# Patient Record
Sex: Female | Born: 1950 | Race: White | Hispanic: No | Marital: Married | Smoking: Current some day smoker
Health system: Southern US, Community
[De-identification: ages and names within clinical notes are randomized; demographics above are authoritative.]

## PROBLEM LIST (undated history)

## (undated) DIAGNOSIS — I10 Essential (primary) hypertension: Secondary | ICD-10-CM

## (undated) DIAGNOSIS — J439 Emphysema, unspecified: Secondary | ICD-10-CM

## (undated) DIAGNOSIS — C189 Malignant neoplasm of colon, unspecified: Secondary | ICD-10-CM

## (undated) DIAGNOSIS — C069 Malignant neoplasm of mouth, unspecified: Secondary | ICD-10-CM

## (undated) HISTORY — DX: Malignant neoplasm of colon, unspecified: C18.9

## (undated) HISTORY — DX: Emphysema, unspecified: J43.9

## (undated) HISTORY — DX: Malignant neoplasm of mouth, unspecified: C06.9

## (undated) HISTORY — DX: Essential (primary) hypertension: I10

## (undated) HISTORY — PX: PLACEMENT OF BREAST IMPLANTS: SHX6334

---

## 2015-07-24 ENCOUNTER — Ambulatory Visit (HOSPITAL_COMMUNITY): Payer: BLUE CROSS/BLUE SHIELD | Admitting: Psychiatry

## 2015-08-13 ENCOUNTER — Ambulatory Visit (HOSPITAL_COMMUNITY): Payer: BLUE CROSS/BLUE SHIELD | Admitting: Psychiatry

## 2016-04-07 DIAGNOSIS — L720 Epidermal cyst: Secondary | ICD-10-CM | POA: Diagnosis not present

## 2016-04-07 DIAGNOSIS — D485 Neoplasm of uncertain behavior of skin: Secondary | ICD-10-CM | POA: Diagnosis not present

## 2016-04-07 DIAGNOSIS — L905 Scar conditions and fibrosis of skin: Secondary | ICD-10-CM | POA: Diagnosis not present

## 2016-04-07 DIAGNOSIS — R600 Localized edema: Secondary | ICD-10-CM | POA: Diagnosis not present

## 2016-04-09 DIAGNOSIS — I10 Essential (primary) hypertension: Secondary | ICD-10-CM | POA: Diagnosis not present

## 2016-04-09 DIAGNOSIS — F419 Anxiety disorder, unspecified: Secondary | ICD-10-CM | POA: Diagnosis not present

## 2016-04-09 DIAGNOSIS — J441 Chronic obstructive pulmonary disease with (acute) exacerbation: Secondary | ICD-10-CM | POA: Diagnosis not present

## 2016-04-09 DIAGNOSIS — Z6823 Body mass index (BMI) 23.0-23.9, adult: Secondary | ICD-10-CM | POA: Diagnosis not present

## 2016-04-09 DIAGNOSIS — Z299 Encounter for prophylactic measures, unspecified: Secondary | ICD-10-CM | POA: Diagnosis not present

## 2016-05-05 DIAGNOSIS — Z6822 Body mass index (BMI) 22.0-22.9, adult: Secondary | ICD-10-CM | POA: Diagnosis not present

## 2016-05-05 DIAGNOSIS — Z0000 Encounter for general adult medical examination without abnormal findings: Secondary | ICD-10-CM | POA: Diagnosis not present

## 2016-05-05 DIAGNOSIS — Z299 Encounter for prophylactic measures, unspecified: Secondary | ICD-10-CM | POA: Diagnosis not present

## 2016-05-05 DIAGNOSIS — Z1389 Encounter for screening for other disorder: Secondary | ICD-10-CM | POA: Diagnosis not present

## 2016-05-05 DIAGNOSIS — Z1211 Encounter for screening for malignant neoplasm of colon: Secondary | ICD-10-CM | POA: Diagnosis not present

## 2016-05-05 DIAGNOSIS — Z7189 Other specified counseling: Secondary | ICD-10-CM | POA: Diagnosis not present

## 2016-05-12 DIAGNOSIS — F419 Anxiety disorder, unspecified: Secondary | ICD-10-CM | POA: Diagnosis not present

## 2016-05-12 DIAGNOSIS — Z79899 Other long term (current) drug therapy: Secondary | ICD-10-CM | POA: Diagnosis not present

## 2016-05-12 DIAGNOSIS — I10 Essential (primary) hypertension: Secondary | ICD-10-CM | POA: Diagnosis not present

## 2016-05-13 DIAGNOSIS — Z23 Encounter for immunization: Secondary | ICD-10-CM | POA: Diagnosis not present

## 2016-05-18 DIAGNOSIS — Z1231 Encounter for screening mammogram for malignant neoplasm of breast: Secondary | ICD-10-CM | POA: Diagnosis not present

## 2016-05-18 DIAGNOSIS — Z9882 Breast implant status: Secondary | ICD-10-CM | POA: Diagnosis not present

## 2016-07-07 DIAGNOSIS — J449 Chronic obstructive pulmonary disease, unspecified: Secondary | ICD-10-CM | POA: Diagnosis not present

## 2016-07-07 DIAGNOSIS — I10 Essential (primary) hypertension: Secondary | ICD-10-CM | POA: Diagnosis not present

## 2016-07-07 DIAGNOSIS — F419 Anxiety disorder, unspecified: Secondary | ICD-10-CM | POA: Diagnosis not present

## 2016-07-07 DIAGNOSIS — F17210 Nicotine dependence, cigarettes, uncomplicated: Secondary | ICD-10-CM | POA: Diagnosis not present

## 2016-07-07 DIAGNOSIS — Z299 Encounter for prophylactic measures, unspecified: Secondary | ICD-10-CM | POA: Diagnosis not present

## 2016-07-07 DIAGNOSIS — J441 Chronic obstructive pulmonary disease with (acute) exacerbation: Secondary | ICD-10-CM | POA: Diagnosis not present

## 2016-07-14 DIAGNOSIS — Z713 Dietary counseling and surveillance: Secondary | ICD-10-CM | POA: Diagnosis not present

## 2016-07-14 DIAGNOSIS — Z299 Encounter for prophylactic measures, unspecified: Secondary | ICD-10-CM | POA: Diagnosis not present

## 2016-07-14 DIAGNOSIS — I10 Essential (primary) hypertension: Secondary | ICD-10-CM | POA: Diagnosis not present

## 2016-07-14 DIAGNOSIS — J441 Chronic obstructive pulmonary disease with (acute) exacerbation: Secondary | ICD-10-CM | POA: Diagnosis not present

## 2016-07-14 DIAGNOSIS — Z6822 Body mass index (BMI) 22.0-22.9, adult: Secondary | ICD-10-CM | POA: Diagnosis not present

## 2016-07-14 DIAGNOSIS — F17210 Nicotine dependence, cigarettes, uncomplicated: Secondary | ICD-10-CM | POA: Diagnosis not present

## 2016-07-28 DIAGNOSIS — E2839 Other primary ovarian failure: Secondary | ICD-10-CM | POA: Diagnosis not present

## 2016-09-18 DIAGNOSIS — J441 Chronic obstructive pulmonary disease with (acute) exacerbation: Secondary | ICD-10-CM | POA: Diagnosis not present

## 2016-09-18 DIAGNOSIS — J449 Chronic obstructive pulmonary disease, unspecified: Secondary | ICD-10-CM | POA: Diagnosis not present

## 2016-09-18 DIAGNOSIS — F17210 Nicotine dependence, cigarettes, uncomplicated: Secondary | ICD-10-CM | POA: Diagnosis not present

## 2016-09-18 DIAGNOSIS — Z713 Dietary counseling and surveillance: Secondary | ICD-10-CM | POA: Diagnosis not present

## 2016-09-18 DIAGNOSIS — Z299 Encounter for prophylactic measures, unspecified: Secondary | ICD-10-CM | POA: Diagnosis not present

## 2016-09-18 DIAGNOSIS — Z6821 Body mass index (BMI) 21.0-21.9, adult: Secondary | ICD-10-CM | POA: Diagnosis not present

## 2016-09-18 DIAGNOSIS — F419 Anxiety disorder, unspecified: Secondary | ICD-10-CM | POA: Diagnosis not present

## 2016-11-05 DIAGNOSIS — K5730 Diverticulosis of large intestine without perforation or abscess without bleeding: Secondary | ICD-10-CM | POA: Diagnosis not present

## 2016-11-05 DIAGNOSIS — F329 Major depressive disorder, single episode, unspecified: Secondary | ICD-10-CM | POA: Diagnosis not present

## 2016-11-05 DIAGNOSIS — Z1211 Encounter for screening for malignant neoplasm of colon: Secondary | ICD-10-CM | POA: Diagnosis not present

## 2016-11-05 DIAGNOSIS — K635 Polyp of colon: Secondary | ICD-10-CM | POA: Diagnosis not present

## 2016-11-05 DIAGNOSIS — J449 Chronic obstructive pulmonary disease, unspecified: Secondary | ICD-10-CM | POA: Diagnosis not present

## 2016-11-05 DIAGNOSIS — M069 Rheumatoid arthritis, unspecified: Secondary | ICD-10-CM | POA: Diagnosis not present

## 2016-11-05 DIAGNOSIS — I10 Essential (primary) hypertension: Secondary | ICD-10-CM | POA: Diagnosis not present

## 2016-11-05 DIAGNOSIS — F419 Anxiety disorder, unspecified: Secondary | ICD-10-CM | POA: Diagnosis not present

## 2016-11-05 DIAGNOSIS — M810 Age-related osteoporosis without current pathological fracture: Secondary | ICD-10-CM | POA: Diagnosis not present

## 2016-11-05 DIAGNOSIS — D125 Benign neoplasm of sigmoid colon: Secondary | ICD-10-CM | POA: Diagnosis not present

## 2016-11-05 DIAGNOSIS — M1990 Unspecified osteoarthritis, unspecified site: Secondary | ICD-10-CM | POA: Diagnosis not present

## 2016-11-13 DIAGNOSIS — F419 Anxiety disorder, unspecified: Secondary | ICD-10-CM | POA: Diagnosis not present

## 2016-11-13 DIAGNOSIS — Z713 Dietary counseling and surveillance: Secondary | ICD-10-CM | POA: Diagnosis not present

## 2016-11-13 DIAGNOSIS — Z6822 Body mass index (BMI) 22.0-22.9, adult: Secondary | ICD-10-CM | POA: Diagnosis not present

## 2016-11-13 DIAGNOSIS — F17210 Nicotine dependence, cigarettes, uncomplicated: Secondary | ICD-10-CM | POA: Diagnosis not present

## 2016-11-13 DIAGNOSIS — Z299 Encounter for prophylactic measures, unspecified: Secondary | ICD-10-CM | POA: Diagnosis not present

## 2016-11-30 DIAGNOSIS — M1612 Unilateral primary osteoarthritis, left hip: Secondary | ICD-10-CM | POA: Diagnosis not present

## 2016-11-30 DIAGNOSIS — M5136 Other intervertebral disc degeneration, lumbar region: Secondary | ICD-10-CM | POA: Diagnosis not present

## 2017-01-04 DIAGNOSIS — M5136 Other intervertebral disc degeneration, lumbar region: Secondary | ICD-10-CM | POA: Diagnosis not present

## 2017-01-04 DIAGNOSIS — M1612 Unilateral primary osteoarthritis, left hip: Secondary | ICD-10-CM | POA: Diagnosis not present

## 2017-01-06 DIAGNOSIS — M1990 Unspecified osteoarthritis, unspecified site: Secondary | ICD-10-CM | POA: Diagnosis not present

## 2017-01-06 DIAGNOSIS — M25552 Pain in left hip: Secondary | ICD-10-CM | POA: Diagnosis not present

## 2017-02-12 DIAGNOSIS — H02403 Unspecified ptosis of bilateral eyelids: Secondary | ICD-10-CM | POA: Diagnosis not present

## 2017-02-26 DIAGNOSIS — Z299 Encounter for prophylactic measures, unspecified: Secondary | ICD-10-CM | POA: Diagnosis not present

## 2017-02-26 DIAGNOSIS — Z6822 Body mass index (BMI) 22.0-22.9, adult: Secondary | ICD-10-CM | POA: Diagnosis not present

## 2017-02-26 DIAGNOSIS — F419 Anxiety disorder, unspecified: Secondary | ICD-10-CM | POA: Diagnosis not present

## 2017-02-26 DIAGNOSIS — J449 Chronic obstructive pulmonary disease, unspecified: Secondary | ICD-10-CM | POA: Diagnosis not present

## 2017-02-26 DIAGNOSIS — I10 Essential (primary) hypertension: Secondary | ICD-10-CM | POA: Diagnosis not present

## 2017-02-26 DIAGNOSIS — F17210 Nicotine dependence, cigarettes, uncomplicated: Secondary | ICD-10-CM | POA: Diagnosis not present

## 2017-02-26 DIAGNOSIS — E7800 Pure hypercholesterolemia, unspecified: Secondary | ICD-10-CM | POA: Diagnosis not present

## 2017-02-26 DIAGNOSIS — Z713 Dietary counseling and surveillance: Secondary | ICD-10-CM | POA: Diagnosis not present

## 2017-03-31 DIAGNOSIS — I10 Essential (primary) hypertension: Secondary | ICD-10-CM | POA: Diagnosis not present

## 2017-03-31 DIAGNOSIS — Z713 Dietary counseling and surveillance: Secondary | ICD-10-CM | POA: Diagnosis not present

## 2017-03-31 DIAGNOSIS — J449 Chronic obstructive pulmonary disease, unspecified: Secondary | ICD-10-CM | POA: Diagnosis not present

## 2017-03-31 DIAGNOSIS — F419 Anxiety disorder, unspecified: Secondary | ICD-10-CM | POA: Diagnosis not present

## 2017-03-31 DIAGNOSIS — E7800 Pure hypercholesterolemia, unspecified: Secondary | ICD-10-CM | POA: Diagnosis not present

## 2017-03-31 DIAGNOSIS — Z6821 Body mass index (BMI) 21.0-21.9, adult: Secondary | ICD-10-CM | POA: Diagnosis not present

## 2017-03-31 DIAGNOSIS — F17210 Nicotine dependence, cigarettes, uncomplicated: Secondary | ICD-10-CM | POA: Diagnosis not present

## 2017-03-31 DIAGNOSIS — F329 Major depressive disorder, single episode, unspecified: Secondary | ICD-10-CM | POA: Diagnosis not present

## 2017-03-31 DIAGNOSIS — Z299 Encounter for prophylactic measures, unspecified: Secondary | ICD-10-CM | POA: Diagnosis not present

## 2017-04-29 DIAGNOSIS — D485 Neoplasm of uncertain behavior of skin: Secondary | ICD-10-CM | POA: Diagnosis not present

## 2017-04-29 DIAGNOSIS — D045 Carcinoma in situ of skin of trunk: Secondary | ICD-10-CM | POA: Diagnosis not present

## 2017-05-10 DIAGNOSIS — Z6821 Body mass index (BMI) 21.0-21.9, adult: Secondary | ICD-10-CM | POA: Diagnosis not present

## 2017-05-10 DIAGNOSIS — F17210 Nicotine dependence, cigarettes, uncomplicated: Secondary | ICD-10-CM | POA: Diagnosis not present

## 2017-05-10 DIAGNOSIS — Z299 Encounter for prophylactic measures, unspecified: Secondary | ICD-10-CM | POA: Diagnosis not present

## 2017-05-10 DIAGNOSIS — I10 Essential (primary) hypertension: Secondary | ICD-10-CM | POA: Diagnosis not present

## 2017-05-10 DIAGNOSIS — J449 Chronic obstructive pulmonary disease, unspecified: Secondary | ICD-10-CM | POA: Diagnosis not present

## 2017-06-01 DIAGNOSIS — C44529 Squamous cell carcinoma of skin of other part of trunk: Secondary | ICD-10-CM | POA: Diagnosis not present

## 2017-06-18 DIAGNOSIS — S92355A Nondisplaced fracture of fifth metatarsal bone, left foot, initial encounter for closed fracture: Secondary | ICD-10-CM | POA: Diagnosis not present

## 2017-07-07 DIAGNOSIS — I10 Essential (primary) hypertension: Secondary | ICD-10-CM | POA: Diagnosis not present

## 2017-07-07 DIAGNOSIS — Z299 Encounter for prophylactic measures, unspecified: Secondary | ICD-10-CM | POA: Diagnosis not present

## 2017-07-07 DIAGNOSIS — J449 Chronic obstructive pulmonary disease, unspecified: Secondary | ICD-10-CM | POA: Diagnosis not present

## 2017-07-07 DIAGNOSIS — F419 Anxiety disorder, unspecified: Secondary | ICD-10-CM | POA: Diagnosis not present

## 2017-07-07 DIAGNOSIS — F17210 Nicotine dependence, cigarettes, uncomplicated: Secondary | ICD-10-CM | POA: Diagnosis not present

## 2017-07-07 DIAGNOSIS — Z6822 Body mass index (BMI) 22.0-22.9, adult: Secondary | ICD-10-CM | POA: Diagnosis not present

## 2017-07-19 DIAGNOSIS — M25552 Pain in left hip: Secondary | ICD-10-CM | POA: Diagnosis not present

## 2017-07-19 DIAGNOSIS — M84375D Stress fracture, left foot, subsequent encounter for fracture with routine healing: Secondary | ICD-10-CM | POA: Diagnosis not present

## 2017-08-09 DIAGNOSIS — M84375D Stress fracture, left foot, subsequent encounter for fracture with routine healing: Secondary | ICD-10-CM | POA: Diagnosis not present

## 2017-09-29 DIAGNOSIS — F419 Anxiety disorder, unspecified: Secondary | ICD-10-CM | POA: Diagnosis not present

## 2017-09-29 DIAGNOSIS — Z6821 Body mass index (BMI) 21.0-21.9, adult: Secondary | ICD-10-CM | POA: Diagnosis not present

## 2017-09-29 DIAGNOSIS — Z2821 Immunization not carried out because of patient refusal: Secondary | ICD-10-CM | POA: Diagnosis not present

## 2017-09-29 DIAGNOSIS — Z299 Encounter for prophylactic measures, unspecified: Secondary | ICD-10-CM | POA: Diagnosis not present

## 2017-09-29 DIAGNOSIS — F17210 Nicotine dependence, cigarettes, uncomplicated: Secondary | ICD-10-CM | POA: Diagnosis not present

## 2017-09-29 DIAGNOSIS — J449 Chronic obstructive pulmonary disease, unspecified: Secondary | ICD-10-CM | POA: Diagnosis not present

## 2017-10-08 DIAGNOSIS — I10 Essential (primary) hypertension: Secondary | ICD-10-CM | POA: Diagnosis not present

## 2017-10-08 DIAGNOSIS — F419 Anxiety disorder, unspecified: Secondary | ICD-10-CM | POA: Diagnosis not present

## 2017-10-08 DIAGNOSIS — Z299 Encounter for prophylactic measures, unspecified: Secondary | ICD-10-CM | POA: Diagnosis not present

## 2017-10-08 DIAGNOSIS — J441 Chronic obstructive pulmonary disease with (acute) exacerbation: Secondary | ICD-10-CM | POA: Diagnosis not present

## 2017-10-08 DIAGNOSIS — J449 Chronic obstructive pulmonary disease, unspecified: Secondary | ICD-10-CM | POA: Diagnosis not present

## 2017-10-08 DIAGNOSIS — Z6821 Body mass index (BMI) 21.0-21.9, adult: Secondary | ICD-10-CM | POA: Diagnosis not present

## 2017-10-12 DIAGNOSIS — L570 Actinic keratosis: Secondary | ICD-10-CM | POA: Diagnosis not present

## 2017-10-12 DIAGNOSIS — L723 Sebaceous cyst: Secondary | ICD-10-CM | POA: Diagnosis not present

## 2017-10-12 DIAGNOSIS — Z85828 Personal history of other malignant neoplasm of skin: Secondary | ICD-10-CM | POA: Diagnosis not present

## 2017-10-20 DIAGNOSIS — F17210 Nicotine dependence, cigarettes, uncomplicated: Secondary | ICD-10-CM | POA: Diagnosis not present

## 2017-10-20 DIAGNOSIS — Z6821 Body mass index (BMI) 21.0-21.9, adult: Secondary | ICD-10-CM | POA: Diagnosis not present

## 2017-10-20 DIAGNOSIS — Z299 Encounter for prophylactic measures, unspecified: Secondary | ICD-10-CM | POA: Diagnosis not present

## 2017-10-20 DIAGNOSIS — E7800 Pure hypercholesterolemia, unspecified: Secondary | ICD-10-CM | POA: Diagnosis not present

## 2017-10-20 DIAGNOSIS — I10 Essential (primary) hypertension: Secondary | ICD-10-CM | POA: Diagnosis not present

## 2017-10-20 DIAGNOSIS — J441 Chronic obstructive pulmonary disease with (acute) exacerbation: Secondary | ICD-10-CM | POA: Diagnosis not present

## 2017-10-20 DIAGNOSIS — J449 Chronic obstructive pulmonary disease, unspecified: Secondary | ICD-10-CM | POA: Diagnosis not present

## 2017-10-21 DIAGNOSIS — E7800 Pure hypercholesterolemia, unspecified: Secondary | ICD-10-CM | POA: Diagnosis not present

## 2017-10-21 DIAGNOSIS — J441 Chronic obstructive pulmonary disease with (acute) exacerbation: Secondary | ICD-10-CM | POA: Diagnosis not present

## 2017-10-21 DIAGNOSIS — Z299 Encounter for prophylactic measures, unspecified: Secondary | ICD-10-CM | POA: Diagnosis not present

## 2017-10-21 DIAGNOSIS — F419 Anxiety disorder, unspecified: Secondary | ICD-10-CM | POA: Diagnosis not present

## 2017-10-21 DIAGNOSIS — J449 Chronic obstructive pulmonary disease, unspecified: Secondary | ICD-10-CM | POA: Diagnosis not present

## 2017-10-21 DIAGNOSIS — Z6821 Body mass index (BMI) 21.0-21.9, adult: Secondary | ICD-10-CM | POA: Diagnosis not present

## 2017-10-21 DIAGNOSIS — I10 Essential (primary) hypertension: Secondary | ICD-10-CM | POA: Diagnosis not present

## 2017-10-22 DIAGNOSIS — J441 Chronic obstructive pulmonary disease with (acute) exacerbation: Secondary | ICD-10-CM | POA: Diagnosis not present

## 2017-10-22 DIAGNOSIS — Z6821 Body mass index (BMI) 21.0-21.9, adult: Secondary | ICD-10-CM | POA: Diagnosis not present

## 2017-10-22 DIAGNOSIS — J449 Chronic obstructive pulmonary disease, unspecified: Secondary | ICD-10-CM | POA: Diagnosis not present

## 2017-10-22 DIAGNOSIS — F411 Generalized anxiety disorder: Secondary | ICD-10-CM | POA: Diagnosis not present

## 2017-10-22 DIAGNOSIS — I10 Essential (primary) hypertension: Secondary | ICD-10-CM | POA: Diagnosis not present

## 2017-10-22 DIAGNOSIS — Z87891 Personal history of nicotine dependence: Secondary | ICD-10-CM | POA: Diagnosis not present

## 2017-10-22 DIAGNOSIS — Z299 Encounter for prophylactic measures, unspecified: Secondary | ICD-10-CM | POA: Diagnosis not present

## 2017-11-03 DIAGNOSIS — Z23 Encounter for immunization: Secondary | ICD-10-CM | POA: Diagnosis not present

## 2017-11-04 DIAGNOSIS — D485 Neoplasm of uncertain behavior of skin: Secondary | ICD-10-CM | POA: Diagnosis not present

## 2017-11-04 DIAGNOSIS — L720 Epidermal cyst: Secondary | ICD-10-CM | POA: Diagnosis not present

## 2017-11-11 DIAGNOSIS — J441 Chronic obstructive pulmonary disease with (acute) exacerbation: Secondary | ICD-10-CM | POA: Diagnosis not present

## 2017-11-11 DIAGNOSIS — R634 Abnormal weight loss: Secondary | ICD-10-CM | POA: Diagnosis not present

## 2017-11-11 DIAGNOSIS — Z4802 Encounter for removal of sutures: Secondary | ICD-10-CM | POA: Diagnosis not present

## 2017-11-11 DIAGNOSIS — J432 Centrilobular emphysema: Secondary | ICD-10-CM | POA: Diagnosis not present

## 2017-11-11 DIAGNOSIS — J439 Emphysema, unspecified: Secondary | ICD-10-CM | POA: Diagnosis not present

## 2017-11-16 DIAGNOSIS — J432 Centrilobular emphysema: Secondary | ICD-10-CM | POA: Diagnosis not present

## 2017-11-29 DIAGNOSIS — J432 Centrilobular emphysema: Secondary | ICD-10-CM | POA: Diagnosis not present

## 2017-12-20 DIAGNOSIS — J432 Centrilobular emphysema: Secondary | ICD-10-CM | POA: Diagnosis not present

## 2018-01-06 DIAGNOSIS — J9610 Chronic respiratory failure, unspecified whether with hypoxia or hypercapnia: Secondary | ICD-10-CM | POA: Diagnosis not present

## 2018-01-06 DIAGNOSIS — Z720 Tobacco use: Secondary | ICD-10-CM | POA: Diagnosis not present

## 2018-01-06 DIAGNOSIS — J449 Chronic obstructive pulmonary disease, unspecified: Secondary | ICD-10-CM | POA: Diagnosis not present

## 2018-01-31 ENCOUNTER — Other Ambulatory Visit (HOSPITAL_COMMUNITY): Payer: Self-pay | Admitting: Sports Medicine

## 2018-01-31 DIAGNOSIS — M810 Age-related osteoporosis without current pathological fracture: Secondary | ICD-10-CM

## 2018-01-31 DIAGNOSIS — Z780 Asymptomatic menopausal state: Secondary | ICD-10-CM

## 2018-01-31 DIAGNOSIS — M25561 Pain in right knee: Secondary | ICD-10-CM | POA: Diagnosis not present

## 2018-01-31 DIAGNOSIS — M1612 Unilateral primary osteoarthritis, left hip: Secondary | ICD-10-CM | POA: Diagnosis not present

## 2018-01-31 DIAGNOSIS — M8000XD Age-related osteoporosis with current pathological fracture, unspecified site, subsequent encounter for fracture with routine healing: Secondary | ICD-10-CM | POA: Diagnosis not present

## 2018-01-31 DIAGNOSIS — M5136 Other intervertebral disc degeneration, lumbar region: Secondary | ICD-10-CM | POA: Diagnosis not present

## 2018-01-31 DIAGNOSIS — M25562 Pain in left knee: Secondary | ICD-10-CM | POA: Diagnosis not present

## 2018-02-03 DIAGNOSIS — M810 Age-related osteoporosis without current pathological fracture: Secondary | ICD-10-CM | POA: Diagnosis not present

## 2018-02-03 DIAGNOSIS — M1612 Unilateral primary osteoarthritis, left hip: Secondary | ICD-10-CM | POA: Diagnosis not present

## 2018-02-03 DIAGNOSIS — M25552 Pain in left hip: Secondary | ICD-10-CM | POA: Diagnosis not present

## 2018-02-03 DIAGNOSIS — E559 Vitamin D deficiency, unspecified: Secondary | ICD-10-CM | POA: Diagnosis not present

## 2018-02-03 DIAGNOSIS — R5383 Other fatigue: Secondary | ICD-10-CM | POA: Diagnosis not present

## 2018-02-07 ENCOUNTER — Ambulatory Visit (HOSPITAL_COMMUNITY)
Admission: RE | Admit: 2018-02-07 | Discharge: 2018-02-07 | Disposition: A | Discharge status: Home / Self Care | Payer: Medicare Other | Source: Ambulatory Visit | Attending: Sports Medicine | Admitting: Sports Medicine

## 2018-02-07 DIAGNOSIS — Z780 Asymptomatic menopausal state: Secondary | ICD-10-CM | POA: Insufficient documentation

## 2018-02-07 DIAGNOSIS — Z13820 Encounter for screening for osteoporosis: Secondary | ICD-10-CM | POA: Insufficient documentation

## 2018-02-07 DIAGNOSIS — M85851 Other specified disorders of bone density and structure, right thigh: Secondary | ICD-10-CM | POA: Diagnosis not present

## 2018-02-07 DIAGNOSIS — M810 Age-related osteoporosis without current pathological fracture: Secondary | ICD-10-CM

## 2018-02-07 DIAGNOSIS — M8580 Other specified disorders of bone density and structure, unspecified site: Secondary | ICD-10-CM | POA: Diagnosis not present

## 2018-02-14 DIAGNOSIS — M1612 Unilateral primary osteoarthritis, left hip: Secondary | ICD-10-CM | POA: Diagnosis not present

## 2018-02-14 DIAGNOSIS — M25561 Pain in right knee: Secondary | ICD-10-CM | POA: Diagnosis not present

## 2018-02-14 DIAGNOSIS — M810 Age-related osteoporosis without current pathological fracture: Secondary | ICD-10-CM | POA: Diagnosis not present

## 2018-02-28 DIAGNOSIS — M25561 Pain in right knee: Secondary | ICD-10-CM | POA: Diagnosis not present

## 2018-02-28 DIAGNOSIS — M810 Age-related osteoporosis without current pathological fracture: Secondary | ICD-10-CM | POA: Diagnosis not present

## 2018-03-01 DIAGNOSIS — J449 Chronic obstructive pulmonary disease, unspecified: Secondary | ICD-10-CM | POA: Diagnosis not present

## 2018-03-01 DIAGNOSIS — I10 Essential (primary) hypertension: Secondary | ICD-10-CM | POA: Diagnosis not present

## 2018-03-01 DIAGNOSIS — F17210 Nicotine dependence, cigarettes, uncomplicated: Secondary | ICD-10-CM | POA: Diagnosis not present

## 2018-03-01 DIAGNOSIS — Z299 Encounter for prophylactic measures, unspecified: Secondary | ICD-10-CM | POA: Diagnosis not present

## 2018-03-01 DIAGNOSIS — Z6821 Body mass index (BMI) 21.0-21.9, adult: Secondary | ICD-10-CM | POA: Diagnosis not present

## 2018-03-01 DIAGNOSIS — F419 Anxiety disorder, unspecified: Secondary | ICD-10-CM | POA: Diagnosis not present

## 2018-04-08 DIAGNOSIS — H353131 Nonexudative age-related macular degeneration, bilateral, early dry stage: Secondary | ICD-10-CM | POA: Diagnosis not present

## 2018-04-08 DIAGNOSIS — H2513 Age-related nuclear cataract, bilateral: Secondary | ICD-10-CM | POA: Diagnosis not present

## 2018-05-09 DIAGNOSIS — M25562 Pain in left knee: Secondary | ICD-10-CM | POA: Diagnosis not present

## 2018-05-09 DIAGNOSIS — M47816 Spondylosis without myelopathy or radiculopathy, lumbar region: Secondary | ICD-10-CM | POA: Diagnosis not present

## 2018-05-09 DIAGNOSIS — M25561 Pain in right knee: Secondary | ICD-10-CM | POA: Diagnosis not present

## 2018-05-09 DIAGNOSIS — M5136 Other intervertebral disc degeneration, lumbar region: Secondary | ICD-10-CM | POA: Diagnosis not present

## 2018-05-11 DIAGNOSIS — M5136 Other intervertebral disc degeneration, lumbar region: Secondary | ICD-10-CM | POA: Diagnosis not present

## 2018-05-11 DIAGNOSIS — I700 Atherosclerosis of aorta: Secondary | ICD-10-CM | POA: Diagnosis not present

## 2018-05-11 DIAGNOSIS — M48061 Spinal stenosis, lumbar region without neurogenic claudication: Secondary | ICD-10-CM | POA: Diagnosis not present

## 2018-05-23 DIAGNOSIS — Z23 Encounter for immunization: Secondary | ICD-10-CM | POA: Diagnosis not present

## 2018-05-25 DIAGNOSIS — Z720 Tobacco use: Secondary | ICD-10-CM | POA: Diagnosis not present

## 2018-05-25 DIAGNOSIS — J449 Chronic obstructive pulmonary disease, unspecified: Secondary | ICD-10-CM | POA: Diagnosis not present

## 2018-05-25 DIAGNOSIS — J9610 Chronic respiratory failure, unspecified whether with hypoxia or hypercapnia: Secondary | ICD-10-CM | POA: Diagnosis not present

## 2018-06-06 DIAGNOSIS — M47816 Spondylosis without myelopathy or radiculopathy, lumbar region: Secondary | ICD-10-CM | POA: Diagnosis not present

## 2018-06-06 DIAGNOSIS — M5416 Radiculopathy, lumbar region: Secondary | ICD-10-CM | POA: Diagnosis not present

## 2018-06-06 DIAGNOSIS — M5136 Other intervertebral disc degeneration, lumbar region: Secondary | ICD-10-CM | POA: Diagnosis not present

## 2018-06-17 DIAGNOSIS — Z1331 Encounter for screening for depression: Secondary | ICD-10-CM | POA: Diagnosis not present

## 2018-06-17 DIAGNOSIS — Z1211 Encounter for screening for malignant neoplasm of colon: Secondary | ICD-10-CM | POA: Diagnosis not present

## 2018-06-17 DIAGNOSIS — F329 Major depressive disorder, single episode, unspecified: Secondary | ICD-10-CM | POA: Diagnosis not present

## 2018-06-17 DIAGNOSIS — F419 Anxiety disorder, unspecified: Secondary | ICD-10-CM | POA: Diagnosis not present

## 2018-06-17 DIAGNOSIS — Z299 Encounter for prophylactic measures, unspecified: Secondary | ICD-10-CM | POA: Diagnosis not present

## 2018-06-17 DIAGNOSIS — I10 Essential (primary) hypertension: Secondary | ICD-10-CM | POA: Diagnosis not present

## 2018-06-17 DIAGNOSIS — Z6821 Body mass index (BMI) 21.0-21.9, adult: Secondary | ICD-10-CM | POA: Diagnosis not present

## 2018-06-17 DIAGNOSIS — Z0000 Encounter for general adult medical examination without abnormal findings: Secondary | ICD-10-CM | POA: Diagnosis not present

## 2018-06-17 DIAGNOSIS — Z7189 Other specified counseling: Secondary | ICD-10-CM | POA: Diagnosis not present

## 2018-06-17 DIAGNOSIS — Z1339 Encounter for screening examination for other mental health and behavioral disorders: Secondary | ICD-10-CM | POA: Diagnosis not present

## 2018-06-17 DIAGNOSIS — E7800 Pure hypercholesterolemia, unspecified: Secondary | ICD-10-CM | POA: Diagnosis not present

## 2018-06-17 DIAGNOSIS — R5383 Other fatigue: Secondary | ICD-10-CM | POA: Diagnosis not present

## 2018-06-20 DIAGNOSIS — M810 Age-related osteoporosis without current pathological fracture: Secondary | ICD-10-CM | POA: Diagnosis not present

## 2018-06-20 DIAGNOSIS — M47816 Spondylosis without myelopathy or radiculopathy, lumbar region: Secondary | ICD-10-CM | POA: Diagnosis not present

## 2018-06-20 DIAGNOSIS — M5136 Other intervertebral disc degeneration, lumbar region: Secondary | ICD-10-CM | POA: Diagnosis not present

## 2018-07-28 DIAGNOSIS — Z1231 Encounter for screening mammogram for malignant neoplasm of breast: Secondary | ICD-10-CM | POA: Diagnosis not present

## 2018-08-01 DIAGNOSIS — Z79899 Other long term (current) drug therapy: Secondary | ICD-10-CM | POA: Diagnosis not present

## 2018-08-01 DIAGNOSIS — M170 Bilateral primary osteoarthritis of knee: Secondary | ICD-10-CM | POA: Diagnosis not present

## 2018-08-01 DIAGNOSIS — F419 Anxiety disorder, unspecified: Secondary | ICD-10-CM | POA: Diagnosis not present

## 2018-08-01 DIAGNOSIS — E7800 Pure hypercholesterolemia, unspecified: Secondary | ICD-10-CM | POA: Diagnosis not present

## 2018-08-01 DIAGNOSIS — E559 Vitamin D deficiency, unspecified: Secondary | ICD-10-CM | POA: Diagnosis not present

## 2018-08-01 DIAGNOSIS — R5383 Other fatigue: Secondary | ICD-10-CM | POA: Diagnosis not present

## 2018-08-01 DIAGNOSIS — M810 Age-related osteoporosis without current pathological fracture: Secondary | ICD-10-CM | POA: Diagnosis not present

## 2018-08-17 ENCOUNTER — Other Ambulatory Visit: Payer: Self-pay | Admitting: Nurse Practitioner

## 2018-08-17 DIAGNOSIS — T8543XA Leakage of breast prosthesis and implant, initial encounter: Secondary | ICD-10-CM

## 2018-08-25 ENCOUNTER — Other Ambulatory Visit: Payer: Self-pay | Admitting: Nurse Practitioner

## 2018-08-27 ENCOUNTER — Ambulatory Visit
Admission: RE | Admit: 2018-08-27 | Discharge: 2018-08-27 | Disposition: A | Discharge status: Home / Self Care | Payer: Medicare Other | Source: Ambulatory Visit | Attending: Nurse Practitioner | Admitting: Nurse Practitioner

## 2018-08-27 DIAGNOSIS — T8543XA Leakage of breast prosthesis and implant, initial encounter: Secondary | ICD-10-CM

## 2018-08-27 DIAGNOSIS — T8543XD Leakage of breast prosthesis and implant, subsequent encounter: Secondary | ICD-10-CM | POA: Diagnosis not present

## 2018-09-02 ENCOUNTER — Other Ambulatory Visit: Payer: Self-pay | Admitting: Nurse Practitioner

## 2018-09-16 DIAGNOSIS — Z299 Encounter for prophylactic measures, unspecified: Secondary | ICD-10-CM | POA: Diagnosis not present

## 2018-09-16 DIAGNOSIS — F17210 Nicotine dependence, cigarettes, uncomplicated: Secondary | ICD-10-CM | POA: Diagnosis not present

## 2018-09-16 DIAGNOSIS — J449 Chronic obstructive pulmonary disease, unspecified: Secondary | ICD-10-CM | POA: Diagnosis not present

## 2018-09-16 DIAGNOSIS — I10 Essential (primary) hypertension: Secondary | ICD-10-CM | POA: Diagnosis not present

## 2018-09-16 DIAGNOSIS — Z6821 Body mass index (BMI) 21.0-21.9, adult: Secondary | ICD-10-CM | POA: Diagnosis not present

## 2018-09-16 DIAGNOSIS — F419 Anxiety disorder, unspecified: Secondary | ICD-10-CM | POA: Diagnosis not present

## 2018-09-19 DIAGNOSIS — M810 Age-related osteoporosis without current pathological fracture: Secondary | ICD-10-CM | POA: Diagnosis not present

## 2018-09-19 DIAGNOSIS — M170 Bilateral primary osteoarthritis of knee: Secondary | ICD-10-CM | POA: Diagnosis not present

## 2018-10-17 ENCOUNTER — Telehealth: Payer: Self-pay | Admitting: Plastic Surgery

## 2018-10-17 DIAGNOSIS — L570 Actinic keratosis: Secondary | ICD-10-CM | POA: Diagnosis not present

## 2018-10-17 DIAGNOSIS — Z85828 Personal history of other malignant neoplasm of skin: Secondary | ICD-10-CM | POA: Diagnosis not present

## 2018-10-17 DIAGNOSIS — L821 Other seborrheic keratosis: Secondary | ICD-10-CM | POA: Diagnosis not present

## 2018-10-17 DIAGNOSIS — L720 Epidermal cyst: Secondary | ICD-10-CM | POA: Diagnosis not present

## 2018-10-17 NOTE — Telephone Encounter (Signed)
Called patient to confirm appointment scheduled for tomorrow. Patient answered the following questions: °1.Has the patient traveled outside of the state of [place] at all within the past 6 weeks? No °2.Does the patient have a fever or cough at all? No °3.Has the patient been tested for COVID? Had a positive COVID test? No °4. Has the patient been in contact with anyone who has tested positive? No ° °

## 2018-10-18 ENCOUNTER — Encounter: Payer: Self-pay | Admitting: Plastic Surgery

## 2018-10-18 ENCOUNTER — Other Ambulatory Visit: Payer: Self-pay

## 2018-10-18 ENCOUNTER — Ambulatory Visit (INDEPENDENT_AMBULATORY_CARE_PROVIDER_SITE_OTHER): Payer: Medicare Other | Admitting: Plastic Surgery

## 2018-10-18 VITALS — BP 145/81 | HR 97 | Temp 97.6°F | Ht 61.0 in | Wt 119.0 lb

## 2018-10-18 DIAGNOSIS — T8543XA Leakage of breast prosthesis and implant, initial encounter: Secondary | ICD-10-CM | POA: Diagnosis not present

## 2018-10-18 NOTE — Progress Notes (Signed)
Patient ID: Maria Stevens, female    DOB: 12-07-1950, 69 y.o.   MRN: [id number]   Chief Complaint  Patient presents with   Advice Only    for ruptured breast implants    The patient is a 68 year old female here with her husband for evaluation of her breast implants.  She underwent placement of silicone breast implants in 1984.  She has noticed the implants getting more over the past she went for mammogram in a rupture was noted on the right with a probable rupture on the left.  She is 5 feet 1 inch tall and weighs 119 pounds.  She is not sure what size implants she has been.  She is a smoker and a prediabetic.  Both breasts are very ptotic.  The implants are firm and irregular with asymmetry.  Clinically they both appear to be ruptured.  I do not see any sign of infection.  Her incision was inframammary.  She is not sure if she wants replacement with the removal or just removal.  She is leaning towards replacement as well.   Review of Systems  Constitutional: Negative.   HENT: Negative.   Eyes: Negative.   Respiratory: Negative.   Cardiovascular: Negative.   Gastrointestinal: Negative.   Endocrine: Negative.   Genitourinary: Negative.   Musculoskeletal: Negative.   Hematological: Negative.   Psychiatric/Behavioral: Negative.     History reviewed. No pertinent past medical history.  History reviewed. No pertinent surgical history.    Current Outpatient Medications:    albuterol (PROVENTIL) (5 MG/ML) 0.5% nebulizer solution, Take 2.5 mg by nebulization every 6 (six) hours as needed for wheezing or shortness of breath., Disp: , Rfl:    ALPRAZolam (XANAX) 1 MG tablet, Take 1 mg by mouth at bedtime as needed for anxiety., Disp: , Rfl:    azithromycin (ZITHROMAX) 250 MG tablet, Take by mouth daily., Disp: , Rfl:    calcium carbonate (OSCAL) 1500 (600 Ca) MG TABS tablet, Take by mouth 2 (two) times daily with a meal., Disp: , Rfl:    calcium citrate-vitamin D (CITRACAL+D)  315-200 MG-UNIT tablet, Take 1 tablet by mouth 2 (two) times daily., Disp: , Rfl:    ergocalciferol (VITAMIN D2) 1.25 MG (50000 UT) capsule, Take 50,000 Units by mouth once a week., Disp: , Rfl:    FLUoxetine (PROZAC) 40 MG capsule, Take 40 mg by mouth daily., Disp: , Rfl:    Fluticasone-Umeclidin-Vilant (TRELEGY ELLIPTA) 100-62.5-25 MCG/INH AEPB, Inhale into the lungs., Disp: , Rfl:    lisinopril (PRINIVIL,ZESTRIL) 10 MG tablet, Take 10 mg by mouth daily., Disp: , Rfl:    rosuvastatin (CRESTOR) 10 MG tablet, Take 10 mg by mouth daily., Disp: , Rfl:    traMADol (ULTRAM) 50 MG tablet, Take by mouth every 6 (six) hours as needed., Disp: , Rfl:    Objective:   Vitals:   10/18/18 0916  BP: (!) 145/81  Pulse: 97  Temp: 97.6 F (36.4 C)  SpO2: 94%    Physical Exam Vitals signs and nursing note reviewed.  Constitutional:      Appearance: Normal appearance.  HENT:     Head: Normocephalic and atraumatic.     Nose: Nose normal.     Mouth/Throat:     Mouth: Mucous membranes are moist.  Cardiovascular:     Rate and Rhythm: Normal rate.     Pulses: Normal pulses.  Pulmonary:     Effort: Pulmonary effort is normal.  Abdominal:     General:  Abdomen is flat.  Neurological:     General: No focal deficit present.     Mental Status: She is alert and oriented to person, place, and time.  Psychiatric:        Mood and Affect: Mood normal.        Behavior: Behavior normal.     Assessment & Plan:  Ruptured right breast implant, initial encounter  Rupture of implant of left breast, initial encounter Recommend removal of bilateral ruptured implants and capsulectomies. Pictures placed in the chart with the patient's permission. Recommend no tobacco use and increasing protein levels. A quote will be given for mastopexy and replacement of implants. Will request authorization for removal of ruptured implants and capsulectomies.  [place], DO

## 2018-11-16 DIAGNOSIS — J449 Chronic obstructive pulmonary disease, unspecified: Secondary | ICD-10-CM | POA: Diagnosis not present

## 2019-01-09 DIAGNOSIS — M810 Age-related osteoporosis without current pathological fracture: Secondary | ICD-10-CM | POA: Diagnosis not present

## 2019-01-09 DIAGNOSIS — M170 Bilateral primary osteoarthritis of knee: Secondary | ICD-10-CM | POA: Diagnosis not present

## 2019-02-16 DIAGNOSIS — I10 Essential (primary) hypertension: Secondary | ICD-10-CM | POA: Diagnosis not present

## 2019-02-16 DIAGNOSIS — J449 Chronic obstructive pulmonary disease, unspecified: Secondary | ICD-10-CM | POA: Diagnosis not present

## 2019-02-16 DIAGNOSIS — F411 Generalized anxiety disorder: Secondary | ICD-10-CM | POA: Diagnosis not present

## 2019-02-16 DIAGNOSIS — Z299 Encounter for prophylactic measures, unspecified: Secondary | ICD-10-CM | POA: Diagnosis not present

## 2019-02-16 DIAGNOSIS — Z6821 Body mass index (BMI) 21.0-21.9, adult: Secondary | ICD-10-CM | POA: Diagnosis not present

## 2019-04-07 ENCOUNTER — Encounter: Payer: Self-pay | Admitting: Surgical

## 2019-04-10 DIAGNOSIS — R5383 Other fatigue: Secondary | ICD-10-CM | POA: Diagnosis not present

## 2019-04-10 DIAGNOSIS — M810 Age-related osteoporosis without current pathological fracture: Secondary | ICD-10-CM | POA: Diagnosis not present

## 2019-04-10 DIAGNOSIS — E559 Vitamin D deficiency, unspecified: Secondary | ICD-10-CM | POA: Diagnosis not present

## 2019-04-21 ENCOUNTER — Encounter: Payer: Self-pay | Admitting: Surgical

## 2019-04-24 ENCOUNTER — Other Ambulatory Visit (HOSPITAL_COMMUNITY): Payer: Self-pay

## 2019-05-02 ENCOUNTER — Encounter: Payer: Self-pay | Admitting: Surgical

## 2019-05-05 ENCOUNTER — Encounter: Payer: Self-pay | Admitting: Plastic Surgery

## 2019-05-09 DIAGNOSIS — Z23 Encounter for immunization: Secondary | ICD-10-CM | POA: Diagnosis not present

## 2019-05-24 DIAGNOSIS — Z23 Encounter for immunization: Secondary | ICD-10-CM | POA: Diagnosis not present

## 2019-05-24 DIAGNOSIS — J449 Chronic obstructive pulmonary disease, unspecified: Secondary | ICD-10-CM | POA: Diagnosis not present

## 2019-05-27 ENCOUNTER — Other Ambulatory Visit (HOSPITAL_COMMUNITY): Payer: Self-pay

## 2019-05-31 ENCOUNTER — Encounter (HOSPITAL_BASED_OUTPATIENT_CLINIC_OR_DEPARTMENT_OTHER): Payer: Self-pay

## 2019-05-31 ENCOUNTER — Ambulatory Visit (HOSPITAL_BASED_OUTPATIENT_CLINIC_OR_DEPARTMENT_OTHER): Admit: 2019-05-31 | Payer: Medicare Other | Admitting: Plastic Surgery

## 2019-05-31 SURGERY — REMOVAL, IMPLANT, BREAST
Anesthesia: General | Site: Breast | Laterality: Bilateral

## 2019-06-06 ENCOUNTER — Encounter: Payer: Self-pay | Admitting: Plastic Surgery

## 2019-06-07 DIAGNOSIS — I10 Essential (primary) hypertension: Secondary | ICD-10-CM | POA: Diagnosis not present

## 2019-06-07 DIAGNOSIS — F419 Anxiety disorder, unspecified: Secondary | ICD-10-CM | POA: Diagnosis not present

## 2019-06-07 DIAGNOSIS — J449 Chronic obstructive pulmonary disease, unspecified: Secondary | ICD-10-CM | POA: Diagnosis not present

## 2019-06-07 DIAGNOSIS — Z299 Encounter for prophylactic measures, unspecified: Secondary | ICD-10-CM | POA: Diagnosis not present

## 2019-06-07 DIAGNOSIS — Z6821 Body mass index (BMI) 21.0-21.9, adult: Secondary | ICD-10-CM | POA: Diagnosis not present

## 2019-08-14 DIAGNOSIS — M170 Bilateral primary osteoarthritis of knee: Secondary | ICD-10-CM | POA: Diagnosis not present

## 2019-09-04 DIAGNOSIS — M810 Age-related osteoporosis without current pathological fracture: Secondary | ICD-10-CM | POA: Diagnosis not present

## 2019-09-04 DIAGNOSIS — M170 Bilateral primary osteoarthritis of knee: Secondary | ICD-10-CM | POA: Diagnosis not present

## 2019-09-13 DIAGNOSIS — Z299 Encounter for prophylactic measures, unspecified: Secondary | ICD-10-CM | POA: Diagnosis not present

## 2019-09-13 DIAGNOSIS — Z7189 Other specified counseling: Secondary | ICD-10-CM | POA: Diagnosis not present

## 2019-09-13 DIAGNOSIS — Z6823 Body mass index (BMI) 23.0-23.9, adult: Secondary | ICD-10-CM | POA: Diagnosis not present

## 2019-09-13 DIAGNOSIS — J449 Chronic obstructive pulmonary disease, unspecified: Secondary | ICD-10-CM | POA: Diagnosis not present

## 2019-09-13 DIAGNOSIS — Z79899 Other long term (current) drug therapy: Secondary | ICD-10-CM | POA: Diagnosis not present

## 2019-09-13 DIAGNOSIS — R5383 Other fatigue: Secondary | ICD-10-CM | POA: Diagnosis not present

## 2019-09-13 DIAGNOSIS — Z1211 Encounter for screening for malignant neoplasm of colon: Secondary | ICD-10-CM | POA: Diagnosis not present

## 2019-09-13 DIAGNOSIS — Z1331 Encounter for screening for depression: Secondary | ICD-10-CM | POA: Diagnosis not present

## 2019-09-13 DIAGNOSIS — E7800 Pure hypercholesterolemia, unspecified: Secondary | ICD-10-CM | POA: Diagnosis not present

## 2019-09-13 DIAGNOSIS — E559 Vitamin D deficiency, unspecified: Secondary | ICD-10-CM | POA: Diagnosis not present

## 2019-09-13 DIAGNOSIS — Z1339 Encounter for screening examination for other mental health and behavioral disorders: Secondary | ICD-10-CM | POA: Diagnosis not present

## 2019-09-13 DIAGNOSIS — Z0000 Encounter for general adult medical examination without abnormal findings: Secondary | ICD-10-CM | POA: Diagnosis not present

## 2019-09-13 DIAGNOSIS — I10 Essential (primary) hypertension: Secondary | ICD-10-CM | POA: Diagnosis not present

## 2019-09-27 DIAGNOSIS — Z85828 Personal history of other malignant neoplasm of skin: Secondary | ICD-10-CM | POA: Diagnosis not present

## 2019-09-27 DIAGNOSIS — L821 Other seborrheic keratosis: Secondary | ICD-10-CM | POA: Diagnosis not present

## 2019-09-27 DIAGNOSIS — L570 Actinic keratosis: Secondary | ICD-10-CM | POA: Diagnosis not present

## 2019-10-02 DIAGNOSIS — M170 Bilateral primary osteoarthritis of knee: Secondary | ICD-10-CM | POA: Diagnosis not present

## 2019-10-11 DIAGNOSIS — J449 Chronic obstructive pulmonary disease, unspecified: Secondary | ICD-10-CM | POA: Diagnosis not present

## 2019-10-11 DIAGNOSIS — F419 Anxiety disorder, unspecified: Secondary | ICD-10-CM | POA: Diagnosis not present

## 2019-10-16 DIAGNOSIS — M170 Bilateral primary osteoarthritis of knee: Secondary | ICD-10-CM | POA: Diagnosis not present

## 2019-10-30 DIAGNOSIS — M170 Bilateral primary osteoarthritis of knee: Secondary | ICD-10-CM | POA: Diagnosis not present

## 2019-11-02 IMAGING — MR MR BILATERAL BREAST WITHOUT CONTRAST
6 of 9 series · 20 of 48 positions shown · non-contrast
Comparison: 07/28/2018

CLINICAL DATA: MR Breast w/o R/o left implant rupture. Pt states
she had a mammogram approx 2 weeks ago and following exam she felt
her implant had ruptured. Has had implants x approx 37 years. No
other symptoms or complaints.

EXAM:
BILATERAL BREAST MRI  WITHOUT CONTRAST
TECHNIQUE: Multiplanar, multisequence MR images of both breasts without
contrast. The exam was used to evaluate implant integrity. Because
of the sequences used and the lack of intravenous contrast, this
study is not diagnostic for breast lesions.

[Series 2: STIR · axial · 4.0mm · 0.70mm/px · z∈[-83,+105]mm · 5 of 48 slices shown (1 of 3)]
[im 1/48]
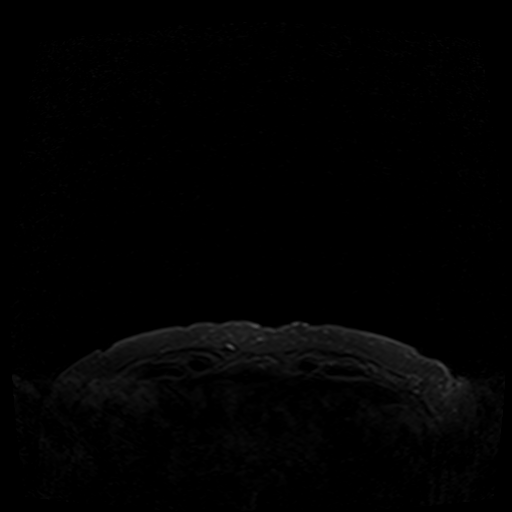
[im 12/48]
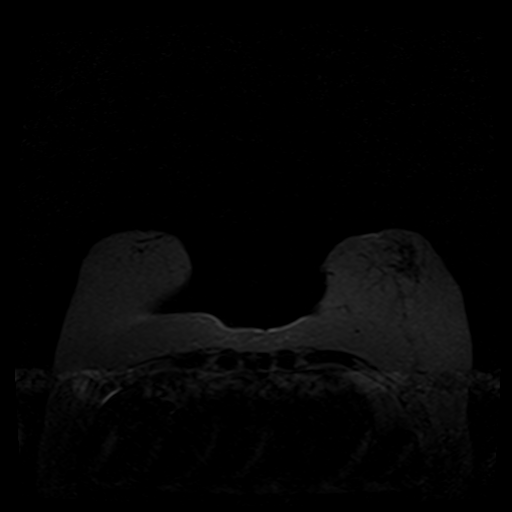
[im 24/48]
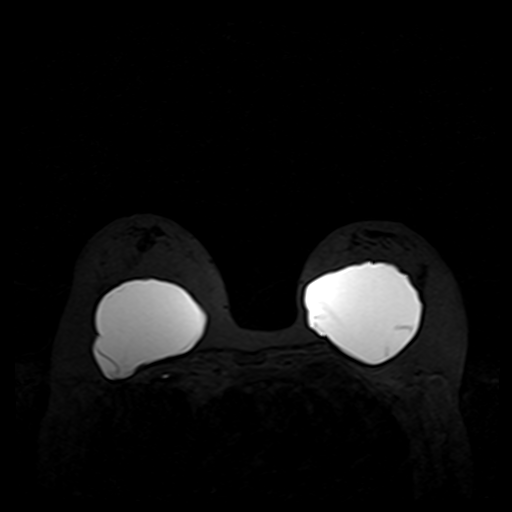
[im 36/48]
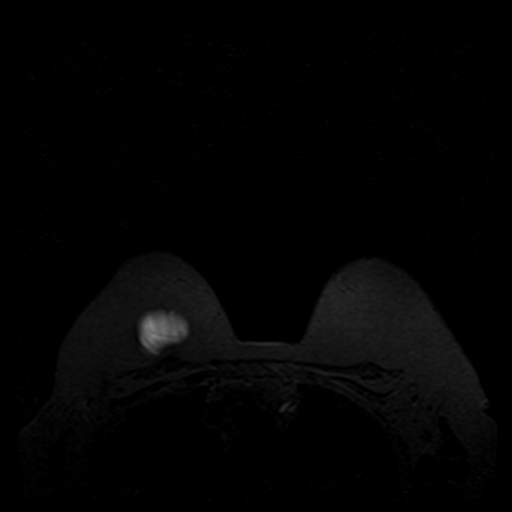
[im 48/48]
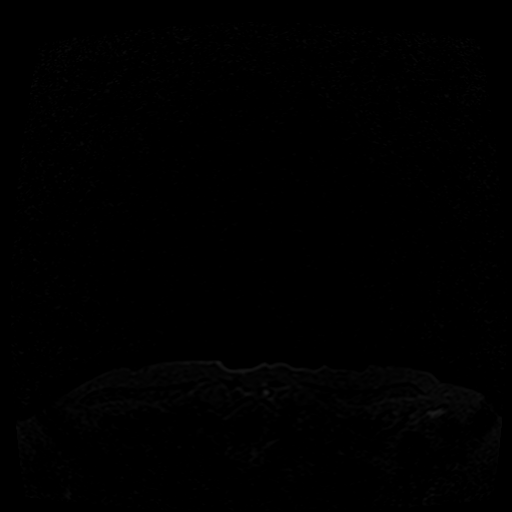

[Series 3: T2 fat-sat · axial · 4.0mm · 0.80mm/px · z∈[-83,+105]mm · 4 of 48 slices shown (1 of 3)]
[im 1/48]
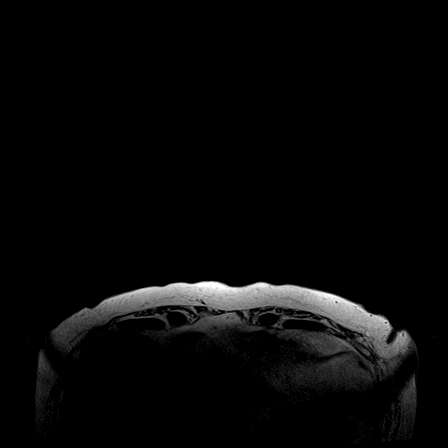
[im 16/48]
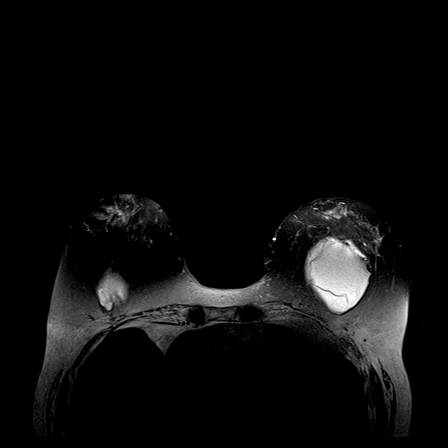
[im 32/48]
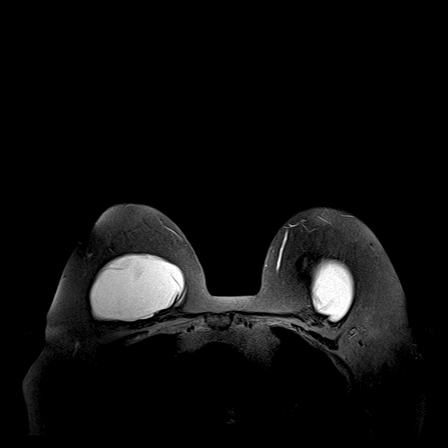
[im 48/48]
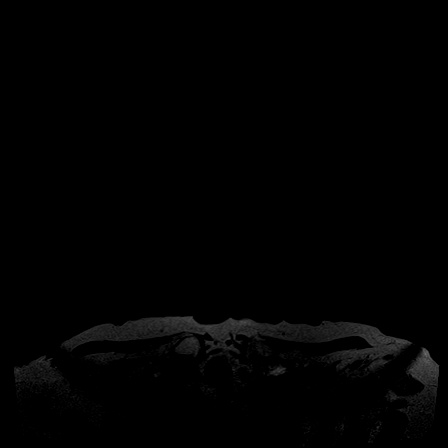

[Series 5: STIR · sagittal · 4.0mm · 0.47mm/px · 3 of 41 slices shown (2 of 3)]
[im 1/41]
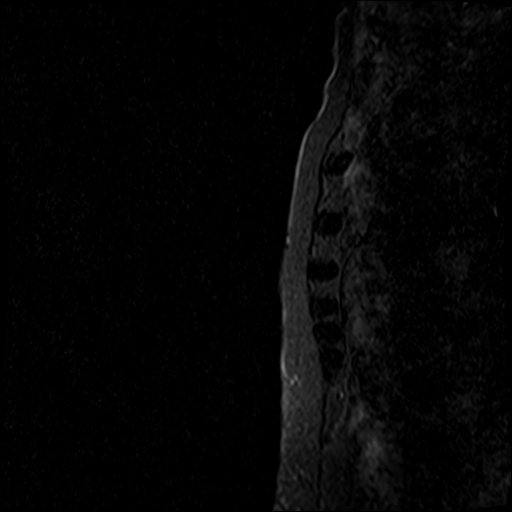
[im 21/41]
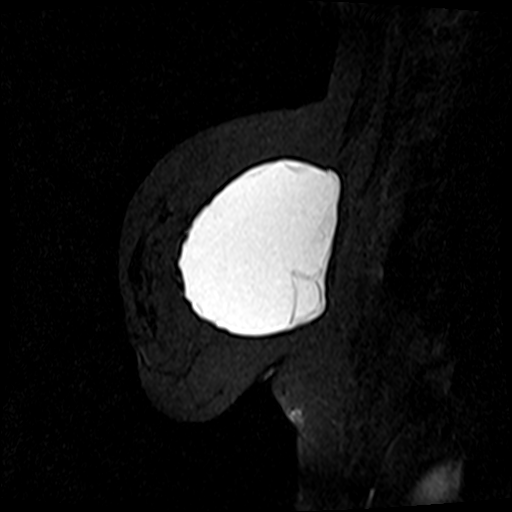
[im 41/41]
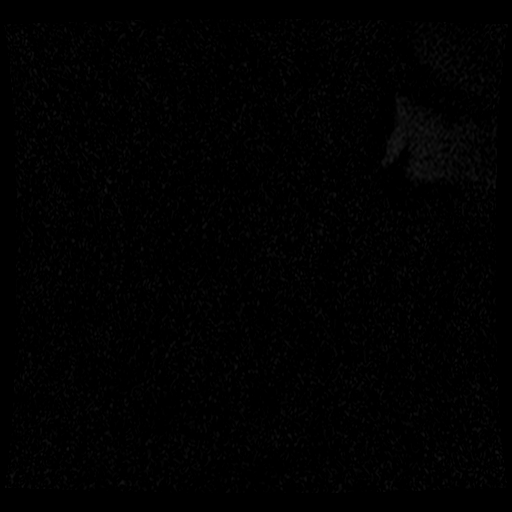

[Series 6: T2 fat-sat · sagittal · 4.0mm · 0.47mm/px · 3 of 41 slices shown (2 of 3)]
[im 1/41]
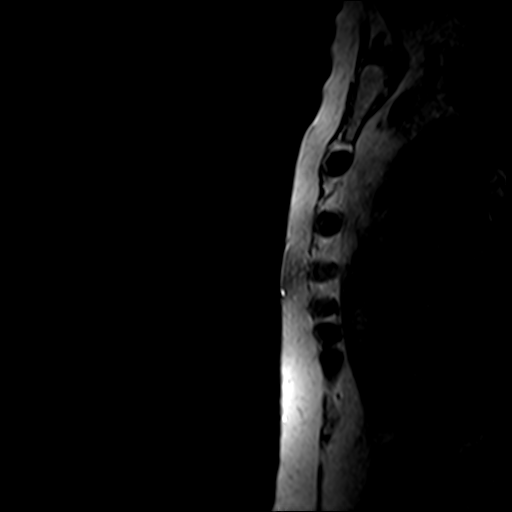
[im 21/41]
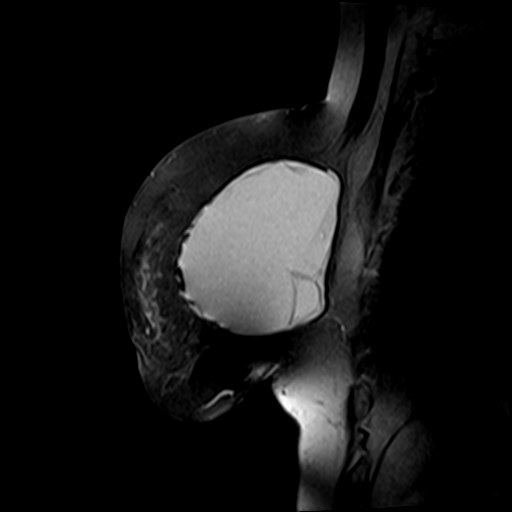
[im 41/41]
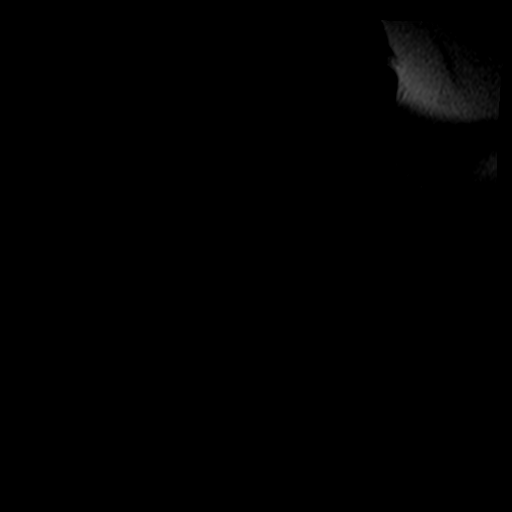

[Series 9: T2 fat-sat · sagittal · 4.0mm · 0.47mm/px · 3 of 41 slices shown (3 of 3)]
[im 1/41]
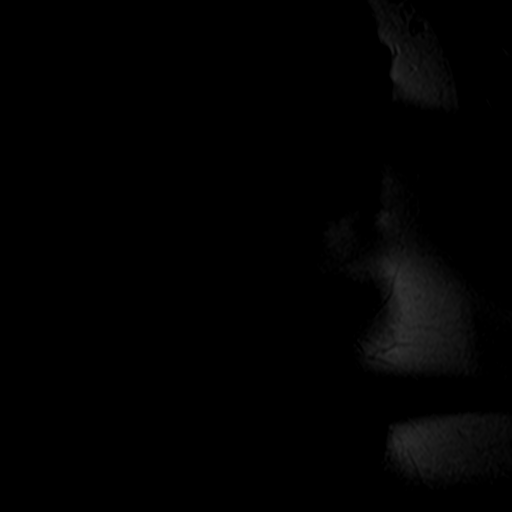
[im 21/41]
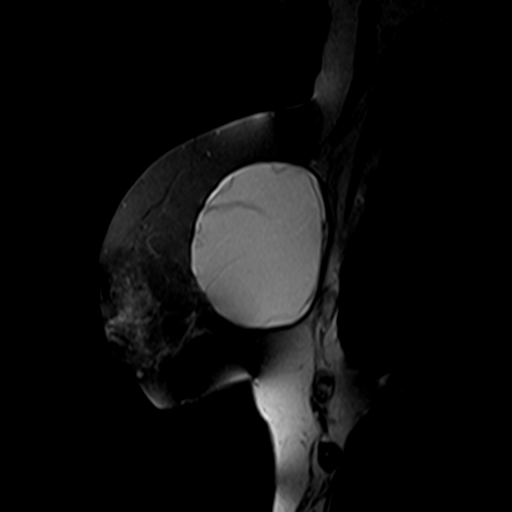
[im 41/41]
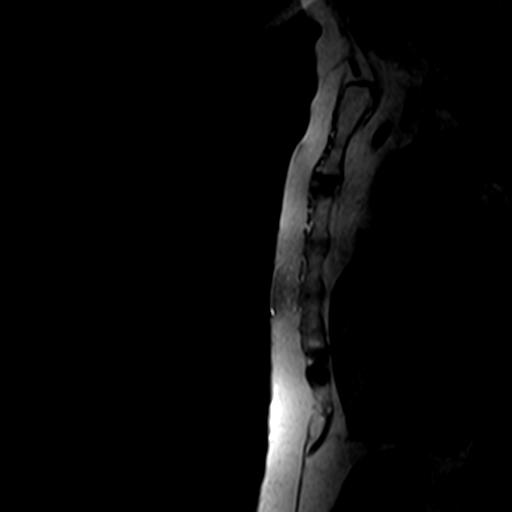

[Series 10: STIR · sagittal · 4.0mm · 0.47mm/px · 2 of 41 slices shown (3 of 3)]
[im 1/41]
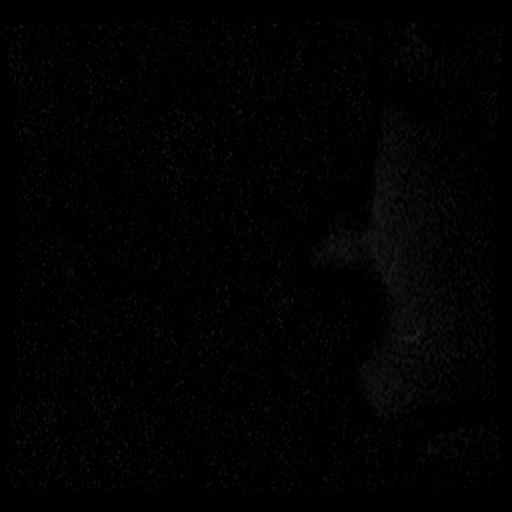
[im 21/41]
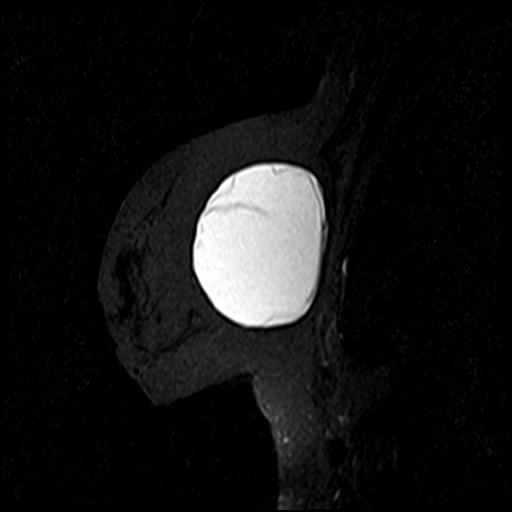

[20 of 48 positions shown; findings below may reference images not displayed]

Three-dimensional MR images were rendered by post-processing of the
original MR data on an independent workstation. The
three-dimensional MR images were interpreted, and findings are
reported in the following complete MRI report for this study. Three
dimensional images were evaluated at the independent [employer]
workstation
FINDINGS: Breast composition: b. Scattered fibroglandular tissue.

Right breast: Retroglandular silicone implant. Subcapsular line and
keyhole sign are consistent with intracapsular rupture. There is
focal capsular bulge along the MEDIAL and inferior aspect of the
RIGHT breast. No MRI evidence for extracapsular rupture.

Left breast: Retroglandular silicone implant is. Subcapsular line
and keyhole signs are consistent with intracapsular rupture. Focal
capsular bulge along the MEDIAL aspect of the LEFT breast. No MRI
evidence for extracapsular rupture.

Ancillary findings: None.
IMPRESSION: Intracapsular silicone implant rupture bilaterally. No MRI evidence
for extracapsular rupture.

RECOMMENDATION:
Recommend plastic surgery consult to discuss options.

Recommend screening mammogram in July 2019.

## 2019-11-13 DIAGNOSIS — Z23 Encounter for immunization: Secondary | ICD-10-CM | POA: Diagnosis not present

## 2020-01-03 DIAGNOSIS — F17200 Nicotine dependence, unspecified, uncomplicated: Secondary | ICD-10-CM | POA: Diagnosis not present

## 2020-01-03 DIAGNOSIS — F418 Other specified anxiety disorders: Secondary | ICD-10-CM | POA: Diagnosis not present

## 2020-01-03 DIAGNOSIS — J449 Chronic obstructive pulmonary disease, unspecified: Secondary | ICD-10-CM | POA: Diagnosis not present

## 2020-01-03 DIAGNOSIS — J9610 Chronic respiratory failure, unspecified whether with hypoxia or hypercapnia: Secondary | ICD-10-CM | POA: Diagnosis not present

## 2020-01-03 DIAGNOSIS — Z122 Encounter for screening for malignant neoplasm of respiratory organs: Secondary | ICD-10-CM | POA: Diagnosis not present

## 2020-01-10 DIAGNOSIS — F419 Anxiety disorder, unspecified: Secondary | ICD-10-CM | POA: Diagnosis not present

## 2020-01-10 DIAGNOSIS — R42 Dizziness and giddiness: Secondary | ICD-10-CM | POA: Diagnosis not present

## 2020-02-09 DIAGNOSIS — J449 Chronic obstructive pulmonary disease, unspecified: Secondary | ICD-10-CM | POA: Diagnosis not present

## 2020-02-09 DIAGNOSIS — Z720 Tobacco use: Secondary | ICD-10-CM | POA: Diagnosis not present

## 2020-02-09 DIAGNOSIS — F329 Major depressive disorder, single episode, unspecified: Secondary | ICD-10-CM | POA: Diagnosis not present

## 2020-02-09 DIAGNOSIS — I10 Essential (primary) hypertension: Secondary | ICD-10-CM | POA: Diagnosis not present

## 2020-03-05 DIAGNOSIS — F17210 Nicotine dependence, cigarettes, uncomplicated: Secondary | ICD-10-CM | POA: Diagnosis not present

## 2020-03-05 DIAGNOSIS — J984 Other disorders of lung: Secondary | ICD-10-CM | POA: Diagnosis not present

## 2020-03-05 DIAGNOSIS — R918 Other nonspecific abnormal finding of lung field: Secondary | ICD-10-CM | POA: Diagnosis not present

## 2020-03-05 DIAGNOSIS — Z122 Encounter for screening for malignant neoplasm of respiratory organs: Secondary | ICD-10-CM | POA: Diagnosis not present

## 2020-03-05 DIAGNOSIS — J479 Bronchiectasis, uncomplicated: Secondary | ICD-10-CM | POA: Diagnosis not present

## 2020-03-12 DIAGNOSIS — R42 Dizziness and giddiness: Secondary | ICD-10-CM | POA: Diagnosis not present

## 2020-03-12 DIAGNOSIS — F419 Anxiety disorder, unspecified: Secondary | ICD-10-CM | POA: Diagnosis not present

## 2020-05-23 DIAGNOSIS — J449 Chronic obstructive pulmonary disease, unspecified: Secondary | ICD-10-CM | POA: Diagnosis not present

## 2020-05-23 DIAGNOSIS — Z299 Encounter for prophylactic measures, unspecified: Secondary | ICD-10-CM | POA: Diagnosis not present

## 2020-05-23 DIAGNOSIS — H1013 Acute atopic conjunctivitis, bilateral: Secondary | ICD-10-CM | POA: Diagnosis not present

## 2020-05-23 DIAGNOSIS — I10 Essential (primary) hypertension: Secondary | ICD-10-CM | POA: Diagnosis not present

## 2020-05-23 DIAGNOSIS — Z6821 Body mass index (BMI) 21.0-21.9, adult: Secondary | ICD-10-CM | POA: Diagnosis not present

## 2020-05-23 DIAGNOSIS — F411 Generalized anxiety disorder: Secondary | ICD-10-CM | POA: Diagnosis not present

## 2020-06-03 DIAGNOSIS — Z23 Encounter for immunization: Secondary | ICD-10-CM | POA: Diagnosis not present

## 2020-06-11 DIAGNOSIS — E039 Hypothyroidism, unspecified: Secondary | ICD-10-CM | POA: Diagnosis not present

## 2020-06-11 DIAGNOSIS — J449 Chronic obstructive pulmonary disease, unspecified: Secondary | ICD-10-CM | POA: Diagnosis not present

## 2020-06-11 DIAGNOSIS — I10 Essential (primary) hypertension: Secondary | ICD-10-CM | POA: Diagnosis not present

## 2020-06-11 DIAGNOSIS — M810 Age-related osteoporosis without current pathological fracture: Secondary | ICD-10-CM | POA: Diagnosis not present

## 2020-06-11 DIAGNOSIS — E785 Hyperlipidemia, unspecified: Secondary | ICD-10-CM | POA: Diagnosis not present

## 2020-06-11 DIAGNOSIS — Z720 Tobacco use: Secondary | ICD-10-CM | POA: Diagnosis not present

## 2020-07-03 DIAGNOSIS — Z122 Encounter for screening for malignant neoplasm of respiratory organs: Secondary | ICD-10-CM | POA: Diagnosis not present

## 2020-07-03 DIAGNOSIS — J9610 Chronic respiratory failure, unspecified whether with hypoxia or hypercapnia: Secondary | ICD-10-CM | POA: Diagnosis not present

## 2020-07-03 DIAGNOSIS — J449 Chronic obstructive pulmonary disease, unspecified: Secondary | ICD-10-CM | POA: Diagnosis not present

## 2020-07-03 DIAGNOSIS — J432 Centrilobular emphysema: Secondary | ICD-10-CM | POA: Diagnosis not present

## 2020-07-12 DIAGNOSIS — R42 Dizziness and giddiness: Secondary | ICD-10-CM | POA: Diagnosis not present

## 2020-07-12 DIAGNOSIS — F419 Anxiety disorder, unspecified: Secondary | ICD-10-CM | POA: Diagnosis not present

## 2020-08-12 DIAGNOSIS — E559 Vitamin D deficiency, unspecified: Secondary | ICD-10-CM | POA: Diagnosis not present

## 2020-08-12 DIAGNOSIS — M810 Age-related osteoporosis without current pathological fracture: Secondary | ICD-10-CM | POA: Diagnosis not present

## 2020-09-25 DIAGNOSIS — L570 Actinic keratosis: Secondary | ICD-10-CM | POA: Diagnosis not present

## 2020-10-09 DIAGNOSIS — R42 Dizziness and giddiness: Secondary | ICD-10-CM | POA: Diagnosis not present

## 2020-10-09 DIAGNOSIS — E7800 Pure hypercholesterolemia, unspecified: Secondary | ICD-10-CM | POA: Diagnosis not present

## 2020-11-13 DIAGNOSIS — Z79899 Other long term (current) drug therapy: Secondary | ICD-10-CM | POA: Diagnosis not present

## 2020-11-13 DIAGNOSIS — Z1331 Encounter for screening for depression: Secondary | ICD-10-CM | POA: Diagnosis not present

## 2020-11-13 DIAGNOSIS — R5383 Other fatigue: Secondary | ICD-10-CM | POA: Diagnosis not present

## 2020-11-13 DIAGNOSIS — Z6822 Body mass index (BMI) 22.0-22.9, adult: Secondary | ICD-10-CM | POA: Diagnosis not present

## 2020-11-13 DIAGNOSIS — Z299 Encounter for prophylactic measures, unspecified: Secondary | ICD-10-CM | POA: Diagnosis not present

## 2020-11-13 DIAGNOSIS — Z0000 Encounter for general adult medical examination without abnormal findings: Secondary | ICD-10-CM | POA: Diagnosis not present

## 2020-11-13 DIAGNOSIS — E7800 Pure hypercholesterolemia, unspecified: Secondary | ICD-10-CM | POA: Diagnosis not present

## 2020-11-13 DIAGNOSIS — Z1339 Encounter for screening examination for other mental health and behavioral disorders: Secondary | ICD-10-CM | POA: Diagnosis not present

## 2020-11-13 DIAGNOSIS — I10 Essential (primary) hypertension: Secondary | ICD-10-CM | POA: Diagnosis not present

## 2020-11-13 DIAGNOSIS — Z7189 Other specified counseling: Secondary | ICD-10-CM | POA: Diagnosis not present

## 2020-12-09 DIAGNOSIS — E7800 Pure hypercholesterolemia, unspecified: Secondary | ICD-10-CM | POA: Diagnosis not present

## 2020-12-09 DIAGNOSIS — M545 Low back pain, unspecified: Secondary | ICD-10-CM | POA: Diagnosis not present

## 2020-12-09 DIAGNOSIS — I10 Essential (primary) hypertension: Secondary | ICD-10-CM | POA: Diagnosis not present

## 2020-12-17 DIAGNOSIS — I739 Peripheral vascular disease, unspecified: Secondary | ICD-10-CM | POA: Diagnosis not present

## 2020-12-17 DIAGNOSIS — Z299 Encounter for prophylactic measures, unspecified: Secondary | ICD-10-CM | POA: Diagnosis not present

## 2020-12-17 DIAGNOSIS — J449 Chronic obstructive pulmonary disease, unspecified: Secondary | ICD-10-CM | POA: Diagnosis not present

## 2020-12-17 DIAGNOSIS — G319 Degenerative disease of nervous system, unspecified: Secondary | ICD-10-CM | POA: Diagnosis not present

## 2020-12-17 DIAGNOSIS — I700 Atherosclerosis of aorta: Secondary | ICD-10-CM | POA: Diagnosis not present

## 2020-12-17 DIAGNOSIS — I10 Essential (primary) hypertension: Secondary | ICD-10-CM | POA: Diagnosis not present

## 2020-12-24 DIAGNOSIS — E7800 Pure hypercholesterolemia, unspecified: Secondary | ICD-10-CM | POA: Diagnosis not present

## 2020-12-24 DIAGNOSIS — E539 Vitamin B deficiency, unspecified: Secondary | ICD-10-CM | POA: Diagnosis not present

## 2020-12-24 DIAGNOSIS — E559 Vitamin D deficiency, unspecified: Secondary | ICD-10-CM | POA: Diagnosis not present

## 2020-12-24 DIAGNOSIS — Z79899 Other long term (current) drug therapy: Secondary | ICD-10-CM | POA: Diagnosis not present

## 2020-12-24 DIAGNOSIS — R5383 Other fatigue: Secondary | ICD-10-CM | POA: Diagnosis not present

## 2020-12-30 DIAGNOSIS — L570 Actinic keratosis: Secondary | ICD-10-CM | POA: Diagnosis not present

## 2020-12-31 DIAGNOSIS — I739 Peripheral vascular disease, unspecified: Secondary | ICD-10-CM | POA: Diagnosis not present

## 2020-12-31 DIAGNOSIS — F419 Anxiety disorder, unspecified: Secondary | ICD-10-CM | POA: Diagnosis not present

## 2020-12-31 DIAGNOSIS — E46 Unspecified protein-calorie malnutrition: Secondary | ICD-10-CM | POA: Diagnosis not present

## 2020-12-31 DIAGNOSIS — Z299 Encounter for prophylactic measures, unspecified: Secondary | ICD-10-CM | POA: Diagnosis not present

## 2020-12-31 DIAGNOSIS — I10 Essential (primary) hypertension: Secondary | ICD-10-CM | POA: Diagnosis not present

## 2021-01-06 DIAGNOSIS — M1612 Unilateral primary osteoarthritis, left hip: Secondary | ICD-10-CM | POA: Diagnosis not present

## 2021-01-06 DIAGNOSIS — M5410 Radiculopathy, site unspecified: Secondary | ICD-10-CM | POA: Diagnosis not present

## 2021-01-15 DIAGNOSIS — M1612 Unilateral primary osteoarthritis, left hip: Secondary | ICD-10-CM | POA: Diagnosis not present

## 2021-01-16 DIAGNOSIS — M1612 Unilateral primary osteoarthritis, left hip: Secondary | ICD-10-CM | POA: Diagnosis not present

## 2021-01-30 DIAGNOSIS — Z20822 Contact with and (suspected) exposure to covid-19: Secondary | ICD-10-CM | POA: Diagnosis not present

## 2021-01-30 DIAGNOSIS — R0602 Shortness of breath: Secondary | ICD-10-CM | POA: Diagnosis not present

## 2021-01-30 DIAGNOSIS — R0689 Other abnormalities of breathing: Secondary | ICD-10-CM | POA: Diagnosis not present

## 2021-01-30 DIAGNOSIS — R0902 Hypoxemia: Secondary | ICD-10-CM | POA: Diagnosis not present

## 2021-01-30 DIAGNOSIS — K5730 Diverticulosis of large intestine without perforation or abscess without bleeding: Secondary | ICD-10-CM | POA: Diagnosis not present

## 2021-01-30 DIAGNOSIS — R197 Diarrhea, unspecified: Secondary | ICD-10-CM | POA: Diagnosis not present

## 2021-01-30 DIAGNOSIS — R627 Adult failure to thrive: Secondary | ICD-10-CM | POA: Diagnosis not present

## 2021-01-30 DIAGNOSIS — I959 Hypotension, unspecified: Secondary | ICD-10-CM | POA: Diagnosis not present

## 2021-01-30 DIAGNOSIS — N281 Cyst of kidney, acquired: Secondary | ICD-10-CM | POA: Diagnosis not present

## 2021-01-30 DIAGNOSIS — J441 Chronic obstructive pulmonary disease with (acute) exacerbation: Secondary | ICD-10-CM | POA: Diagnosis not present

## 2021-01-30 DIAGNOSIS — K639 Disease of intestine, unspecified: Secondary | ICD-10-CM | POA: Diagnosis not present

## 2021-01-30 DIAGNOSIS — R059 Cough, unspecified: Secondary | ICD-10-CM | POA: Diagnosis not present

## 2021-01-30 DIAGNOSIS — N1832 Chronic kidney disease, stage 3b: Secondary | ICD-10-CM | POA: Diagnosis not present

## 2021-01-30 DIAGNOSIS — R531 Weakness: Secondary | ICD-10-CM | POA: Diagnosis not present

## 2021-01-31 DIAGNOSIS — G319 Degenerative disease of nervous system, unspecified: Secondary | ICD-10-CM | POA: Insufficient documentation

## 2021-01-31 DIAGNOSIS — I700 Atherosclerosis of aorta: Secondary | ICD-10-CM | POA: Insufficient documentation

## 2021-01-31 DIAGNOSIS — F1010 Alcohol abuse, uncomplicated: Secondary | ICD-10-CM | POA: Insufficient documentation

## 2021-01-31 DIAGNOSIS — C182 Malignant neoplasm of ascending colon: Secondary | ICD-10-CM | POA: Insufficient documentation

## 2021-01-31 DIAGNOSIS — I739 Peripheral vascular disease, unspecified: Secondary | ICD-10-CM | POA: Insufficient documentation

## 2021-01-31 DIAGNOSIS — J479 Bronchiectasis, uncomplicated: Secondary | ICD-10-CM | POA: Diagnosis not present

## 2021-01-31 DIAGNOSIS — J90 Pleural effusion, not elsewhere classified: Secondary | ICD-10-CM | POA: Diagnosis not present

## 2021-02-03 DIAGNOSIS — R1011 Right upper quadrant pain: Secondary | ICD-10-CM | POA: Diagnosis not present

## 2021-02-09 DIAGNOSIS — R42 Dizziness and giddiness: Secondary | ICD-10-CM | POA: Diagnosis not present

## 2021-02-09 DIAGNOSIS — E7800 Pure hypercholesterolemia, unspecified: Secondary | ICD-10-CM | POA: Diagnosis not present

## 2021-02-10 DIAGNOSIS — Z5189 Encounter for other specified aftercare: Secondary | ICD-10-CM | POA: Diagnosis not present

## 2021-02-10 DIAGNOSIS — K6389 Other specified diseases of intestine: Secondary | ICD-10-CM | POA: Diagnosis not present

## 2021-02-10 DIAGNOSIS — M1612 Unilateral primary osteoarthritis, left hip: Secondary | ICD-10-CM | POA: Diagnosis not present

## 2021-02-10 DIAGNOSIS — D374 Neoplasm of uncertain behavior of colon: Secondary | ICD-10-CM | POA: Diagnosis not present

## 2021-02-20 ENCOUNTER — Ambulatory Visit (INDEPENDENT_AMBULATORY_CARE_PROVIDER_SITE_OTHER): Payer: Medicare Other | Admitting: Emergency Medicine

## 2021-02-20 ENCOUNTER — Other Ambulatory Visit: Payer: Self-pay

## 2021-02-20 ENCOUNTER — Encounter: Payer: Self-pay | Admitting: Emergency Medicine

## 2021-02-20 VITALS — BP 102/64 | HR 93 | Temp 98.1°F

## 2021-02-20 DIAGNOSIS — R9389 Abnormal findings on diagnostic imaging of other specified body structures: Secondary | ICD-10-CM | POA: Insufficient documentation

## 2021-02-20 DIAGNOSIS — J441 Chronic obstructive pulmonary disease with (acute) exacerbation: Secondary | ICD-10-CM | POA: Diagnosis not present

## 2021-02-20 DIAGNOSIS — J9611 Chronic respiratory failure with hypoxia: Secondary | ICD-10-CM | POA: Diagnosis not present

## 2021-02-20 DIAGNOSIS — R911 Solitary pulmonary nodule: Secondary | ICD-10-CM | POA: Diagnosis not present

## 2021-02-20 DIAGNOSIS — J449 Chronic obstructive pulmonary disease, unspecified: Secondary | ICD-10-CM | POA: Insufficient documentation

## 2021-02-20 NOTE — Assessment & Plan Note (Signed)
With recent pneumonia and exacerbation.  Improved.  Continue Trelegy, albuterol as needed.

## 2021-02-20 NOTE — Assessment & Plan Note (Signed)
Clustered nodular infiltrate lower lobe that appears to be most consistent with inflammatory or infectious process.  She was treated for pneumonia and is clinically improved as well.  Malignancy is a possibility especially metastatic disease since she has a colonic mass that has not yet been diagnosed.  She is following with gastroenterology, planning for colonoscopy later this month.  She needs a repeat CT scan of the chest at the end of August to look for serial improvement in her left lower lobe nodular infiltrates.  If these persist then we may have to consider bronchoscopy to get a tissue diagnosis, correlate with her GI findings.

## 2021-02-20 NOTE — Progress Notes (Signed)
Subjective:    Patient ID: Maria Stevens, female    DOB: 03-03-1951, 70 y.o.   MRN: [id number]  HPI  70 year old former smoker (100 pack years) with hypertension, small vessel cerebral disease, hyperlipidemia, anxiety/depression, COPD.  She was admitted to [facility] late July with suspected pneumonia.  Chest x-ray, CT chest were abnormal with multiple scattered cavitary nodular lesions as below.  She was also found to have a mass in the ascending colon. Here to further evaluate the abnormal chest imaging  She was rx w abx, steroids. She feels better. She is on Trelegy, currently taking reliably. Using albuterol nebs 2x a day. Cough improved. Now better able to ambulate. She is compliant w O2 at 3L/min now - had not been using reliably prior to admit.   Chest x-ray 01/30/2021 showed cavitary left suprahilar nodule 2.7 cm with some patchy left basilar infiltrate.  CT scan of the abdomen pelvis 01/30/2021 reviewed by me, shows multiple left lower lobe right nodules 2.3 cm cavitary nodule, 1.9 cm solid nodule at the left base.  There was a 3.0 x 3.5 x 4.6 cm mass lesion in the medial aspect of the ascending colon concerning for malignancy.  CT scan of the chest from 01/30/2022 reviewed by me showed no enlarged mediastinal or hilar lymphadenopathy, patchy areas of bilateral airspace consolidation left greater than right often nodular and masslike particularly in the left lower lobe with a 3.9 x 2.1 cm lesion several demonstrate cavitation.  There is also mild cylindrical bronchiectasis  Review of Systems As per HPI  PMH: Hypertension COPD Small vessel cerebral disease Hyperlipidemia Anxiety/depression DNR status  Family Hx: Melanoma 2 aunts and grandfather probably with TB about 10 years ago Brother small cell lung cancer  Social History   Socioeconomic History   Marital status: Unknown    Spouse name: Not on file   Number of children: Not on file   Years of education: Not on  file   Highest education level: Not on file  Occupational History   Not on file  Tobacco Use   Smoking status: Former    Packs/day: 0.50    Types: Cigarettes    Quit date: 01/30/2021    Years since quitting: 0.0   Smokeless tobacco: Never  Substance and Sexual Activity   Alcohol use: Not Currently    Comment: Stopped drinking 3 weeks ago.   Drug use: Not on file   Sexual activity: Not on file  Other Topics Concern   Not on file  Social History Narrative   Not on file   Social Determinants of Health   Financial Resource Strain: Not on file  Food Insecurity: Not on file  Transportation Needs: Not on file  Physical Activity: Not on file  Stress: Not on file  Social Connections: Not on file  Intimate Partner Violence: Not on file     Allergies  Allergen Reactions   Aspirin Nausea Only     Outpatient Medications Prior to Visit  Medication Sig Dispense Refill   albuterol (PROVENTIL) (5 MG/ML) 0.5% nebulizer solution Take 2.5 mg by nebulization every 6 (six) hours as needed for wheezing or shortness of breath.     ALPRAZolam (XANAX) 1 MG tablet Take 1 mg by mouth at bedtime as needed for anxiety.     azithromycin (ZITHROMAX) 250 MG tablet Take by mouth.     calcium carbonate (OSCAL) 1500 (600 Ca) MG TABS tablet Take by mouth 2 (two) times daily with a meal.  calcium citrate-vitamin D (CITRACAL+D) 315-200 MG-UNIT tablet Take 1 tablet by mouth 2 (two) times daily.     ergocalciferol (VITAMIN D2) 1.25 MG (50000 UT) capsule Take 50,000 Units by mouth once a week.     FLUoxetine (PROZAC) 40 MG capsule Take 40 mg by mouth daily.     Fluticasone-Umeclidin-Vilant 100-62.5-25 MCG/INH AEPB Inhale into the lungs.     lisinopril (PRINIVIL,ZESTRIL) 10 MG tablet Take 10 mg by mouth daily.     rosuvastatin (CRESTOR) 10 MG tablet Take 10 mg by mouth daily.     traMADol (ULTRAM) 50 MG tablet Take by mouth every 6 (six) hours as needed.     azithromycin (ZITHROMAX) 250 MG tablet Take by  mouth daily.     No facility-administered medications prior to visit.         Objective:   Physical Exam Vitals:   02/20/21 1501  BP: 102/64  Pulse: 93  Temp: 98.1 F (36.7 C)  TempSrc: Oral  SpO2: 99%   Gen: Pleasant, very thin w some temporal wasting, in no distress,  normal affect  ENT: No lesions,  mouth clear,  oropharynx clear, no postnasal drip  Neck: No JVD, no stridor  Lungs: No use of accessory muscles, bilateral inspiratory rhonchi, no wheeze  Cardiovascular: RRR, heart sounds normal, no murmur or gallops, no peripheral edema  Musculoskeletal: No deformities, no cyanosis or clubbing  Neuro: alert, awake, non focal  Skin: Warm, no lesions or rash      Assessment & Plan:   Abnormal CT of the chest Clustered nodular infiltrate lower lobe that appears to be most consistent with inflammatory or infectious process.  She was treated for pneumonia and is clinically improved as well.  Malignancy is a possibility especially metastatic disease since she has a colonic mass that has not yet been diagnosed.  She is following with gastroenterology, planning for colonoscopy later this month.  She needs a repeat CT scan of the chest at the end of August to look for serial improvement in her left lower lobe nodular infiltrates.  If these persist then we may have to consider bronchoscopy to get a tissue diagnosis, correlate with her GI findings.  COPD (chronic obstructive pulmonary disease) ([place]) With recent pneumonia and exacerbation.  Improved.  Continue Trelegy, albuterol as needed.  Chronic hypoxemic respiratory failure (HCC) Now on supplemental oxygen at all times and appears to be benefiting.  Plan to continue 3 L/min.    Baltazar Apo, MD, PhD 02/20/2021, 5:40 PM [facility] [phone] or if no answer before 7:00PM call [phone] For any issues after 7:00PM please call eLink [phone]

## 2021-02-20 NOTE — Patient Instructions (Signed)
We will arrange for repeat CT scan of the chest to be done at the end of August to compare with July. We will review the scan together and if the nodular infiltrate at the bottom of the left lung has not resolved and we will discuss whether you need a bronchoscopy to further evaluate. Get your colonoscopy with gastroenterology as planned. Continue Trelegy 1 inhalation once daily.  Rinse and gargle after using. Use your albuterol 1 nebulizer treatment up to every 4 hours if needed for shortness of breath, chest tightness, wheezing. Continue your oxygen at 3 L/min Follow Dr. Lamonte Sakai next available after you have your CT scan of the chest so that we can review the results together.

## 2021-02-20 NOTE — Assessment & Plan Note (Signed)
Now on supplemental oxygen at all times and appears to be benefiting.  Plan to continue 3 L/min.

## 2021-02-26 DIAGNOSIS — D469 Myelodysplastic syndrome, unspecified: Secondary | ICD-10-CM | POA: Diagnosis not present

## 2021-02-26 DIAGNOSIS — Z1159 Encounter for screening for other viral diseases: Secondary | ICD-10-CM | POA: Diagnosis not present

## 2021-02-26 DIAGNOSIS — C182 Malignant neoplasm of ascending colon: Secondary | ICD-10-CM | POA: Diagnosis not present

## 2021-02-26 DIAGNOSIS — D374 Neoplasm of uncertain behavior of colon: Secondary | ICD-10-CM | POA: Diagnosis not present

## 2021-02-26 DIAGNOSIS — R634 Abnormal weight loss: Secondary | ICD-10-CM | POA: Diagnosis not present

## 2021-02-26 DIAGNOSIS — D649 Anemia, unspecified: Secondary | ICD-10-CM | POA: Diagnosis not present

## 2021-02-26 DIAGNOSIS — E860 Dehydration: Secondary | ICD-10-CM | POA: Diagnosis not present

## 2021-03-03 DIAGNOSIS — Z299 Encounter for prophylactic measures, unspecified: Secondary | ICD-10-CM | POA: Diagnosis not present

## 2021-03-03 DIAGNOSIS — U071 COVID-19: Secondary | ICD-10-CM | POA: Diagnosis not present

## 2021-03-03 DIAGNOSIS — J449 Chronic obstructive pulmonary disease, unspecified: Secondary | ICD-10-CM | POA: Diagnosis not present

## 2021-03-11 DIAGNOSIS — D509 Iron deficiency anemia, unspecified: Secondary | ICD-10-CM | POA: Diagnosis not present

## 2021-03-12 DIAGNOSIS — M545 Low back pain, unspecified: Secondary | ICD-10-CM | POA: Diagnosis not present

## 2021-03-12 DIAGNOSIS — I10 Essential (primary) hypertension: Secondary | ICD-10-CM | POA: Diagnosis not present

## 2021-03-14 DIAGNOSIS — C182 Malignant neoplasm of ascending colon: Secondary | ICD-10-CM | POA: Diagnosis not present

## 2021-03-14 DIAGNOSIS — D374 Neoplasm of uncertain behavior of colon: Secondary | ICD-10-CM | POA: Diagnosis not present

## 2021-03-18 ENCOUNTER — Other Ambulatory Visit: Payer: Self-pay

## 2021-03-18 ENCOUNTER — Ambulatory Visit (HOSPITAL_COMMUNITY)
Admission: RE | Admit: 2021-03-18 | Discharge: 2021-03-18 | Disposition: A | Discharge status: Home / Self Care | Payer: Medicare Other | Source: Ambulatory Visit | Attending: Emergency Medicine | Admitting: Emergency Medicine

## 2021-03-18 DIAGNOSIS — J439 Emphysema, unspecified: Secondary | ICD-10-CM | POA: Diagnosis not present

## 2021-03-18 DIAGNOSIS — R911 Solitary pulmonary nodule: Secondary | ICD-10-CM | POA: Insufficient documentation

## 2021-03-18 DIAGNOSIS — I700 Atherosclerosis of aorta: Secondary | ICD-10-CM | POA: Diagnosis not present

## 2021-03-18 DIAGNOSIS — J9811 Atelectasis: Secondary | ICD-10-CM | POA: Diagnosis not present

## 2021-03-18 DIAGNOSIS — R918 Other nonspecific abnormal finding of lung field: Secondary | ICD-10-CM | POA: Diagnosis not present

## 2021-03-19 ENCOUNTER — Telehealth: Payer: Self-pay | Admitting: Emergency Medicine

## 2021-03-19 MED ORDER — FLUTICASONE-UMECLIDIN-VILANT 100-62.5-25 MCG/INH IN AEPB: 1.0000 | INHALATION_SPRAY | Freq: Every day | RESPIRATORY_TRACT | 6 refills | Status: DC

## 2021-03-19 NOTE — Telephone Encounter (Signed)
The rx for the trelegy has been sent to the pharmacy per pts and pharmacy request.  Nothing further is needed.

## 2021-03-21 DIAGNOSIS — J449 Chronic obstructive pulmonary disease, unspecified: Secondary | ICD-10-CM | POA: Diagnosis not present

## 2021-03-21 DIAGNOSIS — H6980 Other specified disorders of Eustachian tube, unspecified ear: Secondary | ICD-10-CM | POA: Diagnosis not present

## 2021-03-21 DIAGNOSIS — I10 Essential (primary) hypertension: Secondary | ICD-10-CM | POA: Diagnosis not present

## 2021-03-21 DIAGNOSIS — Z299 Encounter for prophylactic measures, unspecified: Secondary | ICD-10-CM | POA: Diagnosis not present

## 2021-03-21 DIAGNOSIS — J329 Chronic sinusitis, unspecified: Secondary | ICD-10-CM | POA: Diagnosis not present

## 2021-03-25 ENCOUNTER — Ambulatory Visit: Payer: No Typology Code available for payment source | Admitting: Emergency Medicine

## 2021-03-26 DIAGNOSIS — J441 Chronic obstructive pulmonary disease with (acute) exacerbation: Secondary | ICD-10-CM | POA: Diagnosis not present

## 2021-04-15 DIAGNOSIS — F411 Generalized anxiety disorder: Secondary | ICD-10-CM | POA: Diagnosis not present

## 2021-04-15 DIAGNOSIS — Z23 Encounter for immunization: Secondary | ICD-10-CM | POA: Diagnosis not present

## 2021-04-15 DIAGNOSIS — F339 Major depressive disorder, recurrent, unspecified: Secondary | ICD-10-CM | POA: Diagnosis not present

## 2021-04-15 DIAGNOSIS — I10 Essential (primary) hypertension: Secondary | ICD-10-CM | POA: Diagnosis not present

## 2021-04-15 DIAGNOSIS — J449 Chronic obstructive pulmonary disease, unspecified: Secondary | ICD-10-CM | POA: Diagnosis not present

## 2021-04-15 DIAGNOSIS — Z299 Encounter for prophylactic measures, unspecified: Secondary | ICD-10-CM | POA: Diagnosis not present

## 2021-04-24 ENCOUNTER — Other Ambulatory Visit: Payer: Self-pay

## 2021-04-24 ENCOUNTER — Ambulatory Visit (INDEPENDENT_AMBULATORY_CARE_PROVIDER_SITE_OTHER): Payer: Medicare Other | Admitting: Emergency Medicine

## 2021-04-24 ENCOUNTER — Encounter: Payer: Self-pay | Admitting: Emergency Medicine

## 2021-04-24 VITALS — BP 152/78 | HR 99 | Temp 98.1°F | Resp 16 | Ht 60.0 in | Wt 114.0 lb

## 2021-04-24 DIAGNOSIS — R911 Solitary pulmonary nodule: Secondary | ICD-10-CM

## 2021-04-24 DIAGNOSIS — R9389 Abnormal findings on diagnostic imaging of other specified body structures: Secondary | ICD-10-CM

## 2021-04-24 DIAGNOSIS — K6389 Other specified diseases of intestine: Secondary | ICD-10-CM

## 2021-04-24 DIAGNOSIS — J449 Chronic obstructive pulmonary disease, unspecified: Secondary | ICD-10-CM

## 2021-04-24 NOTE — Progress Notes (Signed)
Subjective:    Patient ID: Maria Stevens, female    DOB: [birth year], 70 y.o.   MRN: [id number]  HPI  70 year old former smoker (100 pack years) with hypertension, small vessel cerebral disease, hyperlipidemia, anxiety/depression, COPD.  She was admitted to [facility] late July with suspected pneumonia.  Chest x-ray, CT chest were abnormal with multiple scattered cavitary nodular lesions as below.  She was also found to have a mass in the ascending colon. Here to further evaluate the abnormal chest imaging  She was rx w abx, steroids. She feels better. She is on Trelegy, currently taking reliably. Using albuterol nebs 2x a day. Cough improved. Now better able to ambulate. She is compliant w O2 at 3L/min now - had not been using reliably prior to admit.   Chest x-ray 01/30/2021 showed cavitary left suprahilar nodule 2.7 cm with some patchy left basilar infiltrate.  CT scan of the abdomen pelvis 01/30/2021 reviewed by me, shows multiple left lower lobe right nodules 2.3 cm cavitary nodule, 1.9 cm solid nodule at the left base.  There was a 3.0 x 3.5 x 4.6 cm mass lesion in the medial aspect of the ascending colon concerning for malignancy.  CT scan of the chest from 01/30/2022 reviewed by me showed no enlarged mediastinal or hilar lymphadenopathy, patchy areas of bilateral airspace consolidation left greater than right often nodular and masslike particularly in the left lower lobe with a 3.9 x 2.1 cm lesion several demonstrate cavitation.  There is also mild cylindrical bronchiectasis    ROV 04/24/21 --follow-up visit 70 year old woman with history of tobacco use and COPD, chronic hypoxemic respiratory failure on 3 L/min.  I saw her after an acute exacerbation and pneumonia, hospitalization with multiple scattered cavitary nodular lesions.  She was also found to have a mass in the ascending colon on that hospitalization.  We planned to repeat her CT scan of the chest to look for interval change  after treatment with antibiotics. She is on Trelegy and believes that she is benefiting. Unfortunately she had COVID since I last saw her - some URI sx but no evidence of an AE. Currently feeling quite well. She is planning for CSY w Dr Ladona Horns soon - it got delayed due to the [place].   Her CT scan of the chest was done on 03/18/2021 and reviewed by me, shows significant improvement in her L cavitary lung nodules, some micronodular disease in the left upper lobe that is unchanged up to 4 mm.   Review of Systems As per HPI  PMH: Hypertension COPD Small vessel cerebral disease Hyperlipidemia Anxiety/depression DNR status  Family Hx: Melanoma 2 aunts and grandfather probably with TB about 20 years ago Brother small cell lung cancer  Social History   Socioeconomic History   Marital status: Unknown    Spouse name: Not on file   Number of children: Not on file   Years of education: Not on file   Highest education level: Not on file  Occupational History   Not on file  Tobacco Use   Smoking status: Former    Packs/day: 0.50    Types: Cigarettes    Quit date: 01/30/2021    Years since quitting: 0.3   Smokeless tobacco: Never  Substance and Sexual Activity   Alcohol use: Not Currently    Comment: Stopped drinking 3 weeks ago.   Drug use: Not on file   Sexual activity: Not on file  Other Topics Concern   Not on file  Social History Narrative   Not on file   Social Determinants of Health   Financial Resource Strain: Not on file  Food Insecurity: Not on file  Transportation Needs: Not on file  Physical Activity: Not on file  Stress: Not on file  Social Connections: Not on file  Intimate Partner Violence: Not on file     Allergies  Allergen Reactions   Aspirin Nausea Only     Outpatient Medications Prior to Visit  Medication Sig Dispense Refill   albuterol (PROVENTIL) (5 MG/ML) 0.5% nebulizer solution Take 2.5 mg by nebulization every 6 (six) hours as needed for  wheezing or shortness of breath.     ALPRAZolam (XANAX) 1 MG tablet Take 1 mg by mouth 3 (three) times daily as needed for anxiety.     azithromycin (ZITHROMAX) 250 MG tablet Take by mouth.     calcium carbonate (OSCAL) 1500 (600 Ca) MG TABS tablet Take by mouth 2 (two) times daily with a meal.     calcium citrate-vitamin D (CITRACAL+D) 315-200 MG-UNIT tablet Take 1 tablet by mouth 2 (two) times daily.     ergocalciferol (VITAMIN D2) 1.25 MG (50000 UT) capsule Take 50,000 Units by mouth once a week.     FLUoxetine (PROZAC) 40 MG capsule Take 40 mg by mouth daily.     Fluticasone-Umeclidin-Vilant 100-62.5-25 MCG/INH AEPB Inhale 1 puff into the lungs daily. 60 each 6   lisinopril (PRINIVIL,ZESTRIL) 10 MG tablet Take 10 mg by mouth daily.     rosuvastatin (CRESTOR) 10 MG tablet Take 10 mg by mouth daily.     No facility-administered medications prior to visit.         Objective:   Physical Exam Vitals:   04/24/21 1158  BP: (!) 152/78  Pulse: 99  Resp: 16  Temp: 98.1 F (36.7 C)  TempSrc: Oral  SpO2: 95%  Weight: 114 lb (51.7 kg)  Height: 5' (1.524 m)   Gen: Pleasant, very thin w some temporal wasting, in no distress,  normal affect  ENT: No lesions,  mouth clear,  oropharynx clear, no postnasal drip  Neck: No JVD, no stridor  Lungs: No use of accessory muscles, bilateral inspiratory rhonchi, no wheeze  Cardiovascular: RRR, heart sounds normal, no murmur or gallops, no peripheral edema  Musculoskeletal: No deformities, no cyanosis or clubbing  Neuro: alert, awake, non focal  Skin: Warm, no lesions or rash      Assessment & Plan:   Abnormal CT of the chest We reviewed the CT scan of the chest today.  Your pulmonary nodules or infiltrates that were seen in July are all improved.  This is great news. We should plan to repeat your CT scan of the chest without contrast in 1 year (September 2023) unless you are recommended to have scanning on a different schedule after your  gastroenterology work-up. Follow with Dr. Lamonte Sakai in 12 months or sooner if you have any problems.   COPD (chronic obstructive pulmonary disease) ([place]) Please continue your Trelegy 1 inhalation once daily.  Rinse and gargle after using. Keep albuterol available to use 2 puffs up to every 4 hours if needed for shortness of breath, chest tightness, wheezing.   Colonic mass Make sure you get your colonoscopy with Dr. Ladona Horns as planned.    Baltazar Apo, MD, PhD 06/12/2021, 4:31 PM [facility] [phone] or if no answer before 7:00PM call [phone] For any issues after 7:00PM please call eLink [phone]

## 2021-04-24 NOTE — Patient Instructions (Addendum)
We reviewed the CT scan of the chest today.  Your pulmonary nodules or infiltrates that were seen in July are all improved.  This is great news. We should plan to repeat your CT scan of the chest without contrast in 1 year (September 2023) and less you are recommended to have scanning on a different schedule after your gastroenterology work-up. Please continue your Trelegy 1 inhalation once daily.  Rinse and gargle after using. Keep albuterol available to use 2 puffs up to every 4 hours if needed for shortness of breath, chest tightness, wheezing.  Make sure you get your colonoscopy with Dr. Ladona Horns as planned. Follow with Dr. Lamonte Sakai in 12 months or sooner if you have any problems.

## 2021-05-12 DIAGNOSIS — I10 Essential (primary) hypertension: Secondary | ICD-10-CM | POA: Diagnosis not present

## 2021-05-12 DIAGNOSIS — M545 Low back pain, unspecified: Secondary | ICD-10-CM | POA: Diagnosis not present

## 2021-05-19 DIAGNOSIS — M8000XA Age-related osteoporosis with current pathological fracture, unspecified site, initial encounter for fracture: Secondary | ICD-10-CM | POA: Diagnosis not present

## 2021-05-19 DIAGNOSIS — M810 Age-related osteoporosis without current pathological fracture: Secondary | ICD-10-CM | POA: Diagnosis not present

## 2021-05-19 DIAGNOSIS — M1612 Unilateral primary osteoarthritis, left hip: Secondary | ICD-10-CM | POA: Diagnosis not present

## 2021-05-22 DIAGNOSIS — K635 Polyp of colon: Secondary | ICD-10-CM | POA: Diagnosis not present

## 2021-05-22 DIAGNOSIS — I10 Essential (primary) hypertension: Secondary | ICD-10-CM | POA: Diagnosis not present

## 2021-05-22 DIAGNOSIS — R933 Abnormal findings on diagnostic imaging of other parts of digestive tract: Secondary | ICD-10-CM | POA: Diagnosis not present

## 2021-05-22 DIAGNOSIS — E785 Hyperlipidemia, unspecified: Secondary | ICD-10-CM | POA: Diagnosis not present

## 2021-05-22 DIAGNOSIS — J449 Chronic obstructive pulmonary disease, unspecified: Secondary | ICD-10-CM | POA: Diagnosis not present

## 2021-05-22 DIAGNOSIS — D125 Benign neoplasm of sigmoid colon: Secondary | ICD-10-CM | POA: Diagnosis not present

## 2021-05-22 DIAGNOSIS — I2510 Atherosclerotic heart disease of native coronary artery without angina pectoris: Secondary | ICD-10-CM | POA: Diagnosis not present

## 2021-05-22 DIAGNOSIS — K5730 Diverticulosis of large intestine without perforation or abscess without bleeding: Secondary | ICD-10-CM | POA: Diagnosis not present

## 2021-05-22 DIAGNOSIS — R197 Diarrhea, unspecified: Secondary | ICD-10-CM | POA: Diagnosis not present

## 2021-05-22 DIAGNOSIS — I700 Atherosclerosis of aorta: Secondary | ICD-10-CM | POA: Diagnosis not present

## 2021-06-11 DIAGNOSIS — K529 Noninfective gastroenteritis and colitis, unspecified: Secondary | ICD-10-CM | POA: Diagnosis not present

## 2021-06-11 DIAGNOSIS — Z87891 Personal history of nicotine dependence: Secondary | ICD-10-CM | POA: Diagnosis not present

## 2021-06-11 DIAGNOSIS — I700 Atherosclerosis of aorta: Secondary | ICD-10-CM | POA: Diagnosis not present

## 2021-06-11 DIAGNOSIS — J449 Chronic obstructive pulmonary disease, unspecified: Secondary | ICD-10-CM | POA: Diagnosis not present

## 2021-06-11 DIAGNOSIS — E7800 Pure hypercholesterolemia, unspecified: Secondary | ICD-10-CM | POA: Diagnosis not present

## 2021-06-11 DIAGNOSIS — I10 Essential (primary) hypertension: Secondary | ICD-10-CM | POA: Diagnosis not present

## 2021-06-11 DIAGNOSIS — Z299 Encounter for prophylactic measures, unspecified: Secondary | ICD-10-CM | POA: Diagnosis not present

## 2021-06-11 DIAGNOSIS — J9611 Chronic respiratory failure with hypoxia: Secondary | ICD-10-CM | POA: Diagnosis not present

## 2021-06-11 DIAGNOSIS — F419 Anxiety disorder, unspecified: Secondary | ICD-10-CM | POA: Diagnosis not present

## 2021-06-12 DIAGNOSIS — K6389 Other specified diseases of intestine: Secondary | ICD-10-CM | POA: Insufficient documentation

## 2021-06-12 NOTE — Assessment & Plan Note (Signed)
Please continue your Trelegy 1 inhalation once daily.  Rinse and gargle after using. Keep albuterol available to use 2 puffs up to every 4 hours if needed for shortness of breath, chest tightness, wheezing.

## 2021-06-12 NOTE — Assessment & Plan Note (Signed)
We reviewed the CT scan of the chest today.  Your pulmonary nodules or infiltrates that were seen in July are all improved.  This is great news. We should plan to repeat your CT scan of the chest without contrast in 1 year (September 2023) unless you are recommended to have scanning on a different schedule after your gastroenterology work-up. Follow with Dr. Lamonte Sakai in 12 months or sooner if you have any problems.

## 2021-06-12 NOTE — Assessment & Plan Note (Signed)
Make sure you get your colonoscopy with Dr. Ladona Horns as planned.

## 2021-06-16 DIAGNOSIS — D126 Benign neoplasm of colon, unspecified: Secondary | ICD-10-CM | POA: Diagnosis not present

## 2021-06-18 DIAGNOSIS — C182 Malignant neoplasm of ascending colon: Secondary | ICD-10-CM | POA: Diagnosis not present

## 2021-06-18 DIAGNOSIS — D508 Other iron deficiency anemias: Secondary | ICD-10-CM | POA: Diagnosis not present

## 2021-06-18 DIAGNOSIS — R9389 Abnormal findings on diagnostic imaging of other specified body structures: Secondary | ICD-10-CM | POA: Diagnosis not present

## 2021-06-20 DIAGNOSIS — S22080A Wedge compression fracture of T11-T12 vertebra, initial encounter for closed fracture: Secondary | ICD-10-CM | POA: Diagnosis not present

## 2021-06-20 DIAGNOSIS — M549 Dorsalgia, unspecified: Secondary | ICD-10-CM | POA: Diagnosis not present

## 2021-06-20 DIAGNOSIS — S32000A Wedge compression fracture of unspecified lumbar vertebra, initial encounter for closed fracture: Secondary | ICD-10-CM | POA: Diagnosis not present

## 2021-06-20 DIAGNOSIS — M545 Low back pain, unspecified: Secondary | ICD-10-CM | POA: Diagnosis not present

## 2021-06-23 DIAGNOSIS — H109 Unspecified conjunctivitis: Secondary | ICD-10-CM | POA: Diagnosis not present

## 2021-06-23 DIAGNOSIS — S0990XA Unspecified injury of head, initial encounter: Secondary | ICD-10-CM | POA: Diagnosis not present

## 2021-06-23 DIAGNOSIS — Z6821 Body mass index (BMI) 21.0-21.9, adult: Secondary | ICD-10-CM | POA: Diagnosis not present

## 2021-06-23 DIAGNOSIS — I10 Essential (primary) hypertension: Secondary | ICD-10-CM | POA: Diagnosis not present

## 2021-06-23 DIAGNOSIS — Z299 Encounter for prophylactic measures, unspecified: Secondary | ICD-10-CM | POA: Diagnosis not present

## 2021-06-25 ENCOUNTER — Telehealth: Payer: Self-pay | Admitting: Emergency Medicine

## 2021-06-26 NOTE — Telephone Encounter (Signed)
Thank you very much for letting me know ?

## 2021-06-26 NOTE — Telephone Encounter (Signed)
FYI to provider

## 2021-07-22 DIAGNOSIS — I10 Essential (primary) hypertension: Secondary | ICD-10-CM | POA: Diagnosis not present

## 2021-07-22 DIAGNOSIS — Z299 Encounter for prophylactic measures, unspecified: Secondary | ICD-10-CM | POA: Diagnosis not present

## 2021-07-22 DIAGNOSIS — K7460 Unspecified cirrhosis of liver: Secondary | ICD-10-CM | POA: Diagnosis not present

## 2021-08-06 DIAGNOSIS — H40033 Anatomical narrow angle, bilateral: Secondary | ICD-10-CM | POA: Diagnosis not present

## 2021-08-06 DIAGNOSIS — H2513 Age-related nuclear cataract, bilateral: Secondary | ICD-10-CM | POA: Diagnosis not present

## 2021-08-08 DIAGNOSIS — J432 Centrilobular emphysema: Secondary | ICD-10-CM | POA: Diagnosis not present

## 2021-08-08 DIAGNOSIS — J449 Chronic obstructive pulmonary disease, unspecified: Secondary | ICD-10-CM | POA: Diagnosis not present

## 2021-08-12 DIAGNOSIS — J449 Chronic obstructive pulmonary disease, unspecified: Secondary | ICD-10-CM | POA: Diagnosis not present

## 2021-08-12 DIAGNOSIS — J432 Centrilobular emphysema: Secondary | ICD-10-CM | POA: Diagnosis not present

## 2021-08-12 DIAGNOSIS — I10 Essential (primary) hypertension: Secondary | ICD-10-CM | POA: Diagnosis not present

## 2021-08-18 DIAGNOSIS — S22080D Wedge compression fracture of T11-T12 vertebra, subsequent encounter for fracture with routine healing: Secondary | ICD-10-CM | POA: Diagnosis not present

## 2021-08-19 DIAGNOSIS — M4856XA Collapsed vertebra, not elsewhere classified, lumbar region, initial encounter for fracture: Secondary | ICD-10-CM | POA: Diagnosis not present

## 2021-08-19 DIAGNOSIS — M4854XA Collapsed vertebra, not elsewhere classified, thoracic region, initial encounter for fracture: Secondary | ICD-10-CM | POA: Diagnosis not present

## 2021-08-19 DIAGNOSIS — M2578 Osteophyte, vertebrae: Secondary | ICD-10-CM | POA: Diagnosis not present

## 2021-08-19 DIAGNOSIS — I700 Atherosclerosis of aorta: Secondary | ICD-10-CM | POA: Diagnosis not present

## 2021-08-19 DIAGNOSIS — M47816 Spondylosis without myelopathy or radiculopathy, lumbar region: Secondary | ICD-10-CM | POA: Diagnosis not present

## 2021-08-21 DIAGNOSIS — S32020A Wedge compression fracture of second lumbar vertebra, initial encounter for closed fracture: Secondary | ICD-10-CM | POA: Diagnosis not present

## 2021-08-26 DIAGNOSIS — I10 Essential (primary) hypertension: Secondary | ICD-10-CM | POA: Diagnosis not present

## 2021-08-26 DIAGNOSIS — R059 Cough, unspecified: Secondary | ICD-10-CM | POA: Diagnosis not present

## 2021-08-26 DIAGNOSIS — J441 Chronic obstructive pulmonary disease with (acute) exacerbation: Secondary | ICD-10-CM | POA: Diagnosis not present

## 2021-08-26 DIAGNOSIS — J9611 Chronic respiratory failure with hypoxia: Secondary | ICD-10-CM | POA: Diagnosis not present

## 2021-08-26 DIAGNOSIS — Z299 Encounter for prophylactic measures, unspecified: Secondary | ICD-10-CM | POA: Diagnosis not present

## 2021-09-02 DIAGNOSIS — I10 Essential (primary) hypertension: Secondary | ICD-10-CM | POA: Diagnosis not present

## 2021-09-02 DIAGNOSIS — M549 Dorsalgia, unspecified: Secondary | ICD-10-CM | POA: Diagnosis not present

## 2021-09-02 DIAGNOSIS — I700 Atherosclerosis of aorta: Secondary | ICD-10-CM | POA: Diagnosis not present

## 2021-09-02 DIAGNOSIS — Z299 Encounter for prophylactic measures, unspecified: Secondary | ICD-10-CM | POA: Diagnosis not present

## 2021-09-04 DIAGNOSIS — X58XXXA Exposure to other specified factors, initial encounter: Secondary | ICD-10-CM | POA: Diagnosis not present

## 2021-09-04 DIAGNOSIS — Y929 Unspecified place or not applicable: Secondary | ICD-10-CM | POA: Diagnosis not present

## 2021-09-04 DIAGNOSIS — S32028A Other fracture of second lumbar vertebra, initial encounter for closed fracture: Secondary | ICD-10-CM | POA: Diagnosis not present

## 2021-09-04 DIAGNOSIS — M8008XA Age-related osteoporosis with current pathological fracture, vertebra(e), initial encounter for fracture: Secondary | ICD-10-CM | POA: Diagnosis not present

## 2021-09-08 DIAGNOSIS — M810 Age-related osteoporosis without current pathological fracture: Secondary | ICD-10-CM | POA: Diagnosis not present

## 2021-09-08 DIAGNOSIS — J432 Centrilobular emphysema: Secondary | ICD-10-CM | POA: Diagnosis not present

## 2021-09-08 DIAGNOSIS — J449 Chronic obstructive pulmonary disease, unspecified: Secondary | ICD-10-CM | POA: Diagnosis not present

## 2021-09-08 DIAGNOSIS — S22080D Wedge compression fracture of T11-T12 vertebra, subsequent encounter for fracture with routine healing: Secondary | ICD-10-CM | POA: Diagnosis not present

## 2021-09-09 DIAGNOSIS — J432 Centrilobular emphysema: Secondary | ICD-10-CM | POA: Diagnosis not present

## 2021-09-09 DIAGNOSIS — J449 Chronic obstructive pulmonary disease, unspecified: Secondary | ICD-10-CM | POA: Diagnosis not present

## 2021-09-19 DIAGNOSIS — Z713 Dietary counseling and surveillance: Secondary | ICD-10-CM | POA: Diagnosis not present

## 2021-09-19 DIAGNOSIS — I10 Essential (primary) hypertension: Secondary | ICD-10-CM | POA: Diagnosis not present

## 2021-09-19 DIAGNOSIS — Z6821 Body mass index (BMI) 21.0-21.9, adult: Secondary | ICD-10-CM | POA: Diagnosis not present

## 2021-09-19 DIAGNOSIS — F17210 Nicotine dependence, cigarettes, uncomplicated: Secondary | ICD-10-CM | POA: Diagnosis not present

## 2021-09-19 DIAGNOSIS — Z299 Encounter for prophylactic measures, unspecified: Secondary | ICD-10-CM | POA: Diagnosis not present

## 2021-09-19 DIAGNOSIS — Z7189 Other specified counseling: Secondary | ICD-10-CM | POA: Diagnosis not present

## 2021-09-19 DIAGNOSIS — Z0000 Encounter for general adult medical examination without abnormal findings: Secondary | ICD-10-CM | POA: Diagnosis not present

## 2021-10-02 DIAGNOSIS — H353131 Nonexudative age-related macular degeneration, bilateral, early dry stage: Secondary | ICD-10-CM | POA: Diagnosis not present

## 2021-10-02 DIAGNOSIS — H2512 Age-related nuclear cataract, left eye: Secondary | ICD-10-CM | POA: Diagnosis not present

## 2021-10-02 DIAGNOSIS — H2511 Age-related nuclear cataract, right eye: Secondary | ICD-10-CM | POA: Diagnosis not present

## 2021-10-02 DIAGNOSIS — Z01818 Encounter for other preprocedural examination: Secondary | ICD-10-CM | POA: Diagnosis not present

## 2021-10-06 DIAGNOSIS — S32020A Wedge compression fracture of second lumbar vertebra, initial encounter for closed fracture: Secondary | ICD-10-CM | POA: Diagnosis not present

## 2021-10-06 DIAGNOSIS — M810 Age-related osteoporosis without current pathological fracture: Secondary | ICD-10-CM | POA: Diagnosis not present

## 2021-10-06 DIAGNOSIS — J449 Chronic obstructive pulmonary disease, unspecified: Secondary | ICD-10-CM | POA: Diagnosis not present

## 2021-10-06 DIAGNOSIS — S22080D Wedge compression fracture of T11-T12 vertebra, subsequent encounter for fracture with routine healing: Secondary | ICD-10-CM | POA: Diagnosis not present

## 2021-10-06 DIAGNOSIS — J432 Centrilobular emphysema: Secondary | ICD-10-CM | POA: Diagnosis not present

## 2021-10-09 DIAGNOSIS — M545 Low back pain, unspecified: Secondary | ICD-10-CM | POA: Diagnosis not present

## 2021-10-09 DIAGNOSIS — I10 Essential (primary) hypertension: Secondary | ICD-10-CM | POA: Diagnosis not present

## 2021-10-10 DIAGNOSIS — J449 Chronic obstructive pulmonary disease, unspecified: Secondary | ICD-10-CM | POA: Diagnosis not present

## 2021-10-10 DIAGNOSIS — Z299 Encounter for prophylactic measures, unspecified: Secondary | ICD-10-CM | POA: Diagnosis not present

## 2021-10-10 DIAGNOSIS — Z6821 Body mass index (BMI) 21.0-21.9, adult: Secondary | ICD-10-CM | POA: Diagnosis not present

## 2021-10-10 DIAGNOSIS — F17210 Nicotine dependence, cigarettes, uncomplicated: Secondary | ICD-10-CM | POA: Diagnosis not present

## 2021-10-10 DIAGNOSIS — I10 Essential (primary) hypertension: Secondary | ICD-10-CM | POA: Diagnosis not present

## 2021-10-10 DIAGNOSIS — J432 Centrilobular emphysema: Secondary | ICD-10-CM | POA: Diagnosis not present

## 2021-10-24 DIAGNOSIS — J441 Chronic obstructive pulmonary disease with (acute) exacerbation: Secondary | ICD-10-CM | POA: Diagnosis not present

## 2021-10-24 DIAGNOSIS — Z299 Encounter for prophylactic measures, unspecified: Secondary | ICD-10-CM | POA: Diagnosis not present

## 2021-10-24 DIAGNOSIS — I700 Atherosclerosis of aorta: Secondary | ICD-10-CM | POA: Diagnosis not present

## 2021-10-24 DIAGNOSIS — R053 Chronic cough: Secondary | ICD-10-CM | POA: Diagnosis not present

## 2021-10-24 DIAGNOSIS — R059 Cough, unspecified: Secondary | ICD-10-CM | POA: Diagnosis not present

## 2021-10-24 DIAGNOSIS — I10 Essential (primary) hypertension: Secondary | ICD-10-CM | POA: Diagnosis not present

## 2021-10-24 DIAGNOSIS — J449 Chronic obstructive pulmonary disease, unspecified: Secondary | ICD-10-CM | POA: Diagnosis not present

## 2021-11-05 DIAGNOSIS — J9610 Chronic respiratory failure, unspecified whether with hypoxia or hypercapnia: Secondary | ICD-10-CM | POA: Diagnosis not present

## 2021-11-05 DIAGNOSIS — J449 Chronic obstructive pulmonary disease, unspecified: Secondary | ICD-10-CM | POA: Diagnosis not present

## 2021-11-05 DIAGNOSIS — Z122 Encounter for screening for malignant neoplasm of respiratory organs: Secondary | ICD-10-CM | POA: Diagnosis not present

## 2021-11-05 DIAGNOSIS — F17210 Nicotine dependence, cigarettes, uncomplicated: Secondary | ICD-10-CM | POA: Diagnosis not present

## 2021-11-05 DIAGNOSIS — F17200 Nicotine dependence, unspecified, uncomplicated: Secondary | ICD-10-CM | POA: Diagnosis not present

## 2021-11-05 DIAGNOSIS — J432 Centrilobular emphysema: Secondary | ICD-10-CM | POA: Diagnosis not present

## 2021-11-05 DIAGNOSIS — R0600 Dyspnea, unspecified: Secondary | ICD-10-CM | POA: Diagnosis not present

## 2021-11-06 DIAGNOSIS — J449 Chronic obstructive pulmonary disease, unspecified: Secondary | ICD-10-CM | POA: Diagnosis not present

## 2021-11-06 DIAGNOSIS — J432 Centrilobular emphysema: Secondary | ICD-10-CM | POA: Diagnosis not present

## 2021-11-09 DIAGNOSIS — J449 Chronic obstructive pulmonary disease, unspecified: Secondary | ICD-10-CM | POA: Diagnosis not present

## 2021-11-09 DIAGNOSIS — J432 Centrilobular emphysema: Secondary | ICD-10-CM | POA: Diagnosis not present

## 2021-11-18 DIAGNOSIS — F17210 Nicotine dependence, cigarettes, uncomplicated: Secondary | ICD-10-CM | POA: Diagnosis not present

## 2021-11-18 DIAGNOSIS — Z6820 Body mass index (BMI) 20.0-20.9, adult: Secondary | ICD-10-CM | POA: Diagnosis not present

## 2021-11-18 DIAGNOSIS — R5383 Other fatigue: Secondary | ICD-10-CM | POA: Diagnosis not present

## 2021-11-18 DIAGNOSIS — E7800 Pure hypercholesterolemia, unspecified: Secondary | ICD-10-CM | POA: Diagnosis not present

## 2021-11-18 DIAGNOSIS — I10 Essential (primary) hypertension: Secondary | ICD-10-CM | POA: Diagnosis not present

## 2021-11-18 DIAGNOSIS — Z299 Encounter for prophylactic measures, unspecified: Secondary | ICD-10-CM | POA: Diagnosis not present

## 2021-11-18 DIAGNOSIS — Z79899 Other long term (current) drug therapy: Secondary | ICD-10-CM | POA: Diagnosis not present

## 2021-11-18 DIAGNOSIS — Z0000 Encounter for general adult medical examination without abnormal findings: Secondary | ICD-10-CM | POA: Diagnosis not present

## 2021-11-28 DIAGNOSIS — H2511 Age-related nuclear cataract, right eye: Secondary | ICD-10-CM | POA: Diagnosis not present

## 2021-12-06 DIAGNOSIS — J449 Chronic obstructive pulmonary disease, unspecified: Secondary | ICD-10-CM | POA: Diagnosis not present

## 2021-12-06 DIAGNOSIS — J432 Centrilobular emphysema: Secondary | ICD-10-CM | POA: Diagnosis not present

## 2021-12-10 DIAGNOSIS — J432 Centrilobular emphysema: Secondary | ICD-10-CM | POA: Diagnosis not present

## 2021-12-10 DIAGNOSIS — J449 Chronic obstructive pulmonary disease, unspecified: Secondary | ICD-10-CM | POA: Diagnosis not present

## 2021-12-18 DIAGNOSIS — H2512 Age-related nuclear cataract, left eye: Secondary | ICD-10-CM | POA: Diagnosis not present

## 2021-12-18 DIAGNOSIS — H269 Unspecified cataract: Secondary | ICD-10-CM | POA: Diagnosis not present

## 2021-12-31 DIAGNOSIS — S41119A Laceration without foreign body of unspecified upper arm, initial encounter: Secondary | ICD-10-CM | POA: Diagnosis not present

## 2021-12-31 DIAGNOSIS — W19XXXA Unspecified fall, initial encounter: Secondary | ICD-10-CM | POA: Diagnosis not present

## 2021-12-31 DIAGNOSIS — F17210 Nicotine dependence, cigarettes, uncomplicated: Secondary | ICD-10-CM | POA: Diagnosis not present

## 2021-12-31 DIAGNOSIS — I10 Essential (primary) hypertension: Secondary | ICD-10-CM | POA: Diagnosis not present

## 2021-12-31 DIAGNOSIS — Z299 Encounter for prophylactic measures, unspecified: Secondary | ICD-10-CM | POA: Diagnosis not present

## 2022-01-06 DIAGNOSIS — J449 Chronic obstructive pulmonary disease, unspecified: Secondary | ICD-10-CM | POA: Diagnosis not present

## 2022-01-06 DIAGNOSIS — J432 Centrilobular emphysema: Secondary | ICD-10-CM | POA: Diagnosis not present

## 2022-01-09 DIAGNOSIS — J449 Chronic obstructive pulmonary disease, unspecified: Secondary | ICD-10-CM | POA: Diagnosis not present

## 2022-01-09 DIAGNOSIS — J432 Centrilobular emphysema: Secondary | ICD-10-CM | POA: Diagnosis not present

## 2022-01-15 DIAGNOSIS — D485 Neoplasm of uncertain behavior of skin: Secondary | ICD-10-CM | POA: Diagnosis not present

## 2022-01-15 DIAGNOSIS — L02416 Cutaneous abscess of left lower limb: Secondary | ICD-10-CM | POA: Diagnosis not present

## 2022-01-15 DIAGNOSIS — L570 Actinic keratosis: Secondary | ICD-10-CM | POA: Diagnosis not present

## 2022-01-15 DIAGNOSIS — H61002 Unspecified perichondritis of left external ear: Secondary | ICD-10-CM | POA: Diagnosis not present

## 2022-01-16 DIAGNOSIS — J9611 Chronic respiratory failure with hypoxia: Secondary | ICD-10-CM | POA: Diagnosis not present

## 2022-01-16 DIAGNOSIS — Z299 Encounter for prophylactic measures, unspecified: Secondary | ICD-10-CM | POA: Diagnosis not present

## 2022-01-16 DIAGNOSIS — I10 Essential (primary) hypertension: Secondary | ICD-10-CM | POA: Diagnosis not present

## 2022-01-16 DIAGNOSIS — I700 Atherosclerosis of aorta: Secondary | ICD-10-CM | POA: Diagnosis not present

## 2022-01-16 DIAGNOSIS — M549 Dorsalgia, unspecified: Secondary | ICD-10-CM | POA: Diagnosis not present

## 2022-02-05 DIAGNOSIS — J449 Chronic obstructive pulmonary disease, unspecified: Secondary | ICD-10-CM | POA: Diagnosis not present

## 2022-02-05 DIAGNOSIS — J432 Centrilobular emphysema: Secondary | ICD-10-CM | POA: Diagnosis not present

## 2022-02-09 DIAGNOSIS — J449 Chronic obstructive pulmonary disease, unspecified: Secondary | ICD-10-CM | POA: Diagnosis not present

## 2022-02-09 DIAGNOSIS — H52223 Regular astigmatism, bilateral: Secondary | ICD-10-CM | POA: Diagnosis not present

## 2022-02-09 DIAGNOSIS — J432 Centrilobular emphysema: Secondary | ICD-10-CM | POA: Diagnosis not present

## 2022-02-09 DIAGNOSIS — H5201 Hypermetropia, right eye: Secondary | ICD-10-CM | POA: Diagnosis not present

## 2022-02-09 DIAGNOSIS — H353112 Nonexudative age-related macular degeneration, right eye, intermediate dry stage: Secondary | ICD-10-CM | POA: Diagnosis not present

## 2022-02-18 DIAGNOSIS — Z299 Encounter for prophylactic measures, unspecified: Secondary | ICD-10-CM | POA: Diagnosis not present

## 2022-02-18 DIAGNOSIS — I10 Essential (primary) hypertension: Secondary | ICD-10-CM | POA: Diagnosis not present

## 2022-02-18 DIAGNOSIS — L02416 Cutaneous abscess of left lower limb: Secondary | ICD-10-CM | POA: Diagnosis not present

## 2022-02-18 DIAGNOSIS — R197 Diarrhea, unspecified: Secondary | ICD-10-CM | POA: Diagnosis not present

## 2022-02-20 DIAGNOSIS — E46 Unspecified protein-calorie malnutrition: Secondary | ICD-10-CM | POA: Diagnosis not present

## 2022-02-20 DIAGNOSIS — I10 Essential (primary) hypertension: Secondary | ICD-10-CM | POA: Diagnosis not present

## 2022-02-20 DIAGNOSIS — Z299 Encounter for prophylactic measures, unspecified: Secondary | ICD-10-CM | POA: Diagnosis not present

## 2022-02-20 DIAGNOSIS — R110 Nausea: Secondary | ICD-10-CM | POA: Diagnosis not present

## 2022-02-20 DIAGNOSIS — J449 Chronic obstructive pulmonary disease, unspecified: Secondary | ICD-10-CM | POA: Diagnosis not present

## 2022-03-08 DIAGNOSIS — J432 Centrilobular emphysema: Secondary | ICD-10-CM | POA: Diagnosis not present

## 2022-03-08 DIAGNOSIS — J449 Chronic obstructive pulmonary disease, unspecified: Secondary | ICD-10-CM | POA: Diagnosis not present

## 2022-03-12 DIAGNOSIS — J432 Centrilobular emphysema: Secondary | ICD-10-CM | POA: Diagnosis not present

## 2022-03-12 DIAGNOSIS — J449 Chronic obstructive pulmonary disease, unspecified: Secondary | ICD-10-CM | POA: Diagnosis not present

## 2022-03-20 ENCOUNTER — Ambulatory Visit (HOSPITAL_COMMUNITY)
Admission: RE | Admit: 2022-03-20 | Discharge: 2022-03-20 | Disposition: A | Discharge status: Home / Self Care | Payer: Medicare Other | Source: Ambulatory Visit | Attending: Emergency Medicine | Admitting: Emergency Medicine

## 2022-03-20 ENCOUNTER — Encounter (HOSPITAL_COMMUNITY): Payer: Self-pay

## 2022-03-20 DIAGNOSIS — J449 Chronic obstructive pulmonary disease, unspecified: Secondary | ICD-10-CM | POA: Diagnosis not present

## 2022-03-20 DIAGNOSIS — R911 Solitary pulmonary nodule: Secondary | ICD-10-CM | POA: Diagnosis not present

## 2022-03-20 DIAGNOSIS — J439 Emphysema, unspecified: Secondary | ICD-10-CM | POA: Diagnosis not present

## 2022-03-20 DIAGNOSIS — I700 Atherosclerosis of aorta: Secondary | ICD-10-CM | POA: Diagnosis not present

## 2022-03-20 DIAGNOSIS — R053 Chronic cough: Secondary | ICD-10-CM | POA: Diagnosis not present

## 2022-03-31 ENCOUNTER — Other Ambulatory Visit (HOSPITAL_COMMUNITY): Payer: No Typology Code available for payment source

## 2022-04-06 DIAGNOSIS — M25562 Pain in left knee: Secondary | ICD-10-CM | POA: Diagnosis not present

## 2022-04-06 DIAGNOSIS — M810 Age-related osteoporosis without current pathological fracture: Secondary | ICD-10-CM | POA: Diagnosis not present

## 2022-04-08 DIAGNOSIS — J432 Centrilobular emphysema: Secondary | ICD-10-CM | POA: Diagnosis not present

## 2022-04-08 DIAGNOSIS — J449 Chronic obstructive pulmonary disease, unspecified: Secondary | ICD-10-CM | POA: Diagnosis not present

## 2022-04-11 DIAGNOSIS — J449 Chronic obstructive pulmonary disease, unspecified: Secondary | ICD-10-CM | POA: Diagnosis not present

## 2022-04-11 DIAGNOSIS — J432 Centrilobular emphysema: Secondary | ICD-10-CM | POA: Diagnosis not present

## 2022-04-22 ENCOUNTER — Ambulatory Visit (INDEPENDENT_AMBULATORY_CARE_PROVIDER_SITE_OTHER): Payer: Self-pay | Admitting: Emergency Medicine

## 2022-04-22 ENCOUNTER — Encounter: Payer: Self-pay | Admitting: Emergency Medicine

## 2022-04-22 VITALS — BP 108/64 | HR 94 | Temp 98.3°F | Ht 63.0 in | Wt 107.6 lb

## 2022-04-22 DIAGNOSIS — Z23 Encounter for immunization: Secondary | ICD-10-CM

## 2022-04-22 DIAGNOSIS — J449 Chronic obstructive pulmonary disease, unspecified: Secondary | ICD-10-CM

## 2022-04-22 DIAGNOSIS — J9611 Chronic respiratory failure with hypoxia: Secondary | ICD-10-CM

## 2022-04-22 DIAGNOSIS — R9389 Abnormal findings on diagnostic imaging of other specified body structures: Secondary | ICD-10-CM

## 2022-04-22 NOTE — Assessment & Plan Note (Signed)
Nodular densities have resolved.  Good news.  I think at this point we can transition her over into the lung cancer screening program.  Her next (initial) would need to be in September 2024.

## 2022-04-22 NOTE — Assessment & Plan Note (Addendum)
No flares.  Intermittent symptoms.  Fairly limited but checks her saturations and does not desaturate often with exertion.  Please continue Trelegy 1 inhalation once daily.  Rinse and gargle after using. Keep your albuterol available to use 2 puffs if needed for shortness of breath, chest tightness, wheezing. Flu shot today. Follow with Dr. Lamonte Sakai in 12 months or sooner if you have any problems.

## 2022-04-22 NOTE — Assessment & Plan Note (Addendum)
Continue oxygen at night and as needed with exertion to keep saturations greater than 90%.

## 2022-04-22 NOTE — Patient Instructions (Signed)
Please continue Trelegy 1 inhalation once daily.  Rinse and gargle after using. Keep your albuterol available to use 2 puffs if needed for shortness of breath, chest tightness, wheezing. We reviewed your CT scan of the chest today.  This shows improvement compared with your priors.  This is good news. We will plan to enroll you in the lung cancer screening program with your initial scan to be done in September 2024 Work hard on stopping your cigarettes altogether. Flu shot today. Follow with Dr. Lamonte Sakai in 12 months or sooner if you have any problems.

## 2022-04-22 NOTE — Progress Notes (Signed)
   Subjective:    Patient ID: Maria Stevens, female    DOB: 1951-03-25, 71 y.o.   MRN: [id number]  HPI  ROV 04/22/22 --71 year old woman with history of former tobacco and associated COPD, chronic hypoxemic respiratory failure.  I saw her for this as well as multiple scattered cavitary nodules on CT scan of the chest that decreased in size on interval follow-up scan.  She also had a mass lesion noted in the colon - SCY was reassuring. She will still rarely smoke a cigarette. She has had some episodic dyspnea, about 3x a week. Uses albuterol most days but not all. Trelegy reliably. Has had some increased cough, has felt some URI sx over the last several days. Has used some mucinex. She uses her O2 prn, at night while sleeping.   CT chest 03/20/2022 reviewed by me shows some areas of centrilobular paraseptal emphysema and bronchial wall thickening.  There is a bandlike scar in the left lung base, lingula and medial right middle lobe, stable compared with priors.   Review of Systems As per HPI      Objective:   Physical Exam Vitals:   04/22/22 1330  BP: 108/64  Pulse: 94  Temp: 98.3 F (36.8 C)  TempSrc: Oral  SpO2: 92%  Weight: 107 lb 9.6 oz (48.8 kg)  Height: '5\' 3"'$  (1.6 m)   Gen: Pleasant, very thin w some temporal wasting, in no distress,  normal affect  ENT: No lesions,  mouth clear, poor dentition, oropharynx clear, no postnasal drip  Neck: No JVD, no stridor  Lungs: No use of accessory muscles, bilateral inspiratory rhonchi, no wheeze.  She does wheeze on forced expiration  Cardiovascular: RRR, heart sounds normal, no murmur or gallops, no peripheral edema  Musculoskeletal: No deformities, no cyanosis or clubbing  Neuro: alert, awake, non focal  Skin: Warm, no lesions or rash      Assessment & Plan:   Abnormal CT of the chest Nodular densities have resolved.  Good news.  I think at this point we can transition her over into the lung cancer screening program.  Her  next (initial) would need to be in September 2024.  COPD (chronic obstructive pulmonary disease) (HCC) No flares.  Intermittent symptoms.  Fairly limited but checks her saturations and does not desaturate often with exertion.  Please continue Trelegy 1 inhalation once daily.  Rinse and gargle after using. Keep your albuterol available to use 2 puffs if needed for shortness of breath, chest tightness, wheezing. Flu shot today. Follow with Dr. Lamonte Sakai in 12 months or sooner if you have any problems.   Chronic hypoxemic respiratory failure (HCC) Continue oxygen at night and as needed with exertion to keep saturations greater than 90%.    Baltazar Apo, MD, PhD 04/22/2022, 1:59 PM [facility] [phone] or if no answer before 7:00PM call [phone] For any issues after 7:00PM please call eLink [phone]

## 2022-05-08 DIAGNOSIS — J432 Centrilobular emphysema: Secondary | ICD-10-CM | POA: Diagnosis not present

## 2022-05-08 DIAGNOSIS — J449 Chronic obstructive pulmonary disease, unspecified: Secondary | ICD-10-CM | POA: Diagnosis not present

## 2022-05-12 DIAGNOSIS — J449 Chronic obstructive pulmonary disease, unspecified: Secondary | ICD-10-CM | POA: Diagnosis not present

## 2022-05-12 DIAGNOSIS — J432 Centrilobular emphysema: Secondary | ICD-10-CM | POA: Diagnosis not present

## 2022-05-14 DIAGNOSIS — H1013 Acute atopic conjunctivitis, bilateral: Secondary | ICD-10-CM | POA: Diagnosis not present

## 2022-05-14 DIAGNOSIS — H16223 Keratoconjunctivitis sicca, not specified as Sjogren's, bilateral: Secondary | ICD-10-CM | POA: Diagnosis not present

## 2022-05-14 DIAGNOSIS — H0288B Meibomian gland dysfunction left eye, upper and lower eyelids: Secondary | ICD-10-CM | POA: Diagnosis not present

## 2022-05-14 DIAGNOSIS — H0288A Meibomian gland dysfunction right eye, upper and lower eyelids: Secondary | ICD-10-CM | POA: Diagnosis not present

## 2022-05-26 DIAGNOSIS — Z299 Encounter for prophylactic measures, unspecified: Secondary | ICD-10-CM | POA: Diagnosis not present

## 2022-05-26 DIAGNOSIS — I10 Essential (primary) hypertension: Secondary | ICD-10-CM | POA: Diagnosis not present

## 2022-05-26 DIAGNOSIS — L309 Dermatitis, unspecified: Secondary | ICD-10-CM | POA: Diagnosis not present

## 2022-06-08 DIAGNOSIS — J449 Chronic obstructive pulmonary disease, unspecified: Secondary | ICD-10-CM | POA: Diagnosis not present

## 2022-06-08 DIAGNOSIS — J432 Centrilobular emphysema: Secondary | ICD-10-CM | POA: Diagnosis not present

## 2022-06-11 DIAGNOSIS — J432 Centrilobular emphysema: Secondary | ICD-10-CM | POA: Diagnosis not present

## 2022-06-11 DIAGNOSIS — J449 Chronic obstructive pulmonary disease, unspecified: Secondary | ICD-10-CM | POA: Diagnosis not present

## 2022-06-18 DIAGNOSIS — H0288A Meibomian gland dysfunction right eye, upper and lower eyelids: Secondary | ICD-10-CM | POA: Diagnosis not present

## 2022-06-18 DIAGNOSIS — H0288B Meibomian gland dysfunction left eye, upper and lower eyelids: Secondary | ICD-10-CM | POA: Diagnosis not present

## 2022-06-18 DIAGNOSIS — H1013 Acute atopic conjunctivitis, bilateral: Secondary | ICD-10-CM | POA: Diagnosis not present

## 2022-06-18 DIAGNOSIS — H16223 Keratoconjunctivitis sicca, not specified as Sjogren's, bilateral: Secondary | ICD-10-CM | POA: Diagnosis not present

## 2022-07-08 DIAGNOSIS — J449 Chronic obstructive pulmonary disease, unspecified: Secondary | ICD-10-CM | POA: Diagnosis not present

## 2022-07-08 DIAGNOSIS — J432 Centrilobular emphysema: Secondary | ICD-10-CM | POA: Diagnosis not present

## 2022-07-12 DIAGNOSIS — J432 Centrilobular emphysema: Secondary | ICD-10-CM | POA: Diagnosis not present

## 2022-07-12 DIAGNOSIS — J449 Chronic obstructive pulmonary disease, unspecified: Secondary | ICD-10-CM | POA: Diagnosis not present

## 2022-07-30 ENCOUNTER — Telehealth: Payer: Self-pay | Admitting: Emergency Medicine

## 2022-07-30 MED ORDER — PREDNISONE 10 MG PO TABS: ORAL_TABLET | ORAL | 0 refills | Status: AC

## 2022-07-30 NOTE — Telephone Encounter (Signed)
Called and spoke with pt's spouse letting him know recs and he verbalized understanding. Medication has been sent to pharmacy for pt and told Jori Moll to make sure pt kept her appt. Nothing further needed.

## 2022-07-30 NOTE — Telephone Encounter (Signed)
Called and spoke with pt's spouse who states that pt has had problems with cough and congestion for about 1-2 weeks now. Jori Moll stated that pt has been coughing up phlegm that is clear in color. He states that pt may have some wheezing. Pt is on azithromycin Mon/Wed/Fri.   Pt has tried robitussin, mucinex, and nyquil. Did make pt an appt with Dr. Annamaria Boots Monday, 1/22.  Katie, please advise if you have any other recs for pt prior to Monday's appt.

## 2022-07-30 NOTE — Telephone Encounter (Signed)
Please send prednisone taper 10 mg tablets - 4 tabs for 2 days, then 3 tabs for 2 days, 2 tabs for 2 days, then 1 tab for 2 days, then stop. Take in AM with food. Continue all inhalers. Use nebulizer treatments at least twice daily until symptoms improve. Keep appt for 1/22. ED precautions if symptoms worsen, low sats <88-90% or unable to speak in complete sentences. Thanks

## 2022-08-01 NOTE — Progress Notes (Signed)
08/03/22- 71 yoF former and occasional smoker followed by Dr Lamonte Sakai for COPD, Chronic Hypoxic Respiratory Failure, Multiple Lung Nodules, complicated by hx Colon Cancer,  O2 2L sleep - prednisone taper started 07/30/22, Neb albuterol, Trelegy 100, Zithromax TIW,  Called 1/18 reporting 1-2 week exacerbation. Covid vax-no Flu vax-had States productive coughing and chest congestion has been occurring for 2-3 weeks now Husband here [place] on face 3 days ago. Face is sore but no lost teeth. Weak. Sputum mostly white. No blood or fever.Increased cough and dyspnea with exertion. Discussed meds.  ROS-see HPI   Negative unless "+" Constitutional:    weight loss, night sweats, fevers, chills, fatigue, lassitude. HEENT:    headaches, difficulty swallowing, tooth/dental problems, sore throat,       sneezing, itching, ear ache, nasal congestion, post nasal drip, snoring CV:    chest pain, orthopnea, PND, swelling in lower extremities, anasarca,                                  dizziness, palpitations Resp:   shortness of breath with exertion or at rest.                productive cough,   non-productive cough, coughing up of blood.              change in color of mucus.  wheezing.   Skin:    rash or lesions. GI:  No-   heartburn, indigestion, abdominal pain, nausea, vomiting, diarrhea,                 change in bowel habits, loss of appetite GU: dysuria, change in color of urine, no urgency or frequency.   flank pain. MS:   joint pain, stiffness, decreased range of motion, back pain. Neuro-     nothing unusual Psych:  change in mood or affect.  depression or anxiety.   memory loss.  OBJ- Physical Exam General- Alert, Oriented, Affect-appropriate, Distress- none acute, +frail,  Skin- rash-none, lesions- none, excoriation- none Lymphadenopathy- none Head- atraumatic            Eyes- Gross vision intact, PERRLA, conjunctivae and secretions clear            Ears- Hearing, canals-normal            Nose-  Clear, no-Septal dev, mucus, polyps, erosion, perforation             Throat- Mallampati II , mucosa clear , drainage- none, tonsils- atrophic, +poor dentition Neck- flexible , trachea midline, no stridor , thyroid nl, carotid no bruit Chest - symmetrical excursion , unlabored           Heart/CV- RRR , no murmur , no gallop  , no rub, nl s1 s2                           - JVD- none , edema- none, stasis changes- none, varices- none           Lung- clear to P&A, wheeze- none, cough- none , dullness-none, rub- none           Chest wall-  Abd-  Br/ Gen/ Rectal- Not done, not indicated Extrem- cyanosis- none, clubbing, none, atrophy- none, strength- nl Neuro- grossly intact to observation

## 2022-08-03 ENCOUNTER — Encounter: Payer: Self-pay | Admitting: Internal Medicine

## 2022-08-03 ENCOUNTER — Ambulatory Visit: Payer: Medicare Other | Admitting: Internal Medicine

## 2022-08-03 ENCOUNTER — Ambulatory Visit (INDEPENDENT_AMBULATORY_CARE_PROVIDER_SITE_OTHER): Payer: Medicare Other

## 2022-08-03 VITALS — BP 122/78 | HR 97 | Ht 61.0 in | Wt 109.6 lb

## 2022-08-03 DIAGNOSIS — J441 Chronic obstructive pulmonary disease with (acute) exacerbation: Secondary | ICD-10-CM

## 2022-08-03 DIAGNOSIS — J9611 Chronic respiratory failure with hypoxia: Secondary | ICD-10-CM

## 2022-08-03 DIAGNOSIS — J209 Acute bronchitis, unspecified: Secondary | ICD-10-CM | POA: Diagnosis not present

## 2022-08-03 LAB — POC COVID19 BINAXNOW: SARS Coronavirus 2 Ag: NEGATIVE

## 2022-08-03 MED ORDER — DOXYCYCLINE HYCLATE 100 MG PO TABS: 100.0000 mg | ORAL_TABLET | Freq: Two times a day (BID) | ORAL | 0 refills | Status: DC

## 2022-08-03 NOTE — Patient Instructions (Addendum)
Script sent for doxycycline to add on as antibiotic  Finish the prednisone taper  You can use your nebulizer as often as every 6 hours if it helps and you need it.  Continue you Trelegy  Continue your azithromycin  You can add otc Mucinex if needed to help loosen chest congestion  See Dr Woody Seller if your face keeps hurting. He may want to xray it.  Order- CXR   dx exacerbation of COPD  See Dr Lamonte Sakai In about 6 weeks  As you get over this episode, you may want to ask about Pulmonary Rehab to build some stamina.

## 2022-08-03 NOTE — Assessment & Plan Note (Signed)
COPD exacerbation Plan- Mucinex, increase neb use to 2-3 timess daily for now, doxycycline, CXR

## 2022-08-03 NOTE — Assessment & Plan Note (Signed)
Continue O2 2L for sleep and as needed

## 2022-08-08 DIAGNOSIS — J449 Chronic obstructive pulmonary disease, unspecified: Secondary | ICD-10-CM | POA: Diagnosis not present

## 2022-08-08 DIAGNOSIS — J432 Centrilobular emphysema: Secondary | ICD-10-CM | POA: Diagnosis not present

## 2022-08-12 DIAGNOSIS — J432 Centrilobular emphysema: Secondary | ICD-10-CM | POA: Diagnosis not present

## 2022-08-12 DIAGNOSIS — J449 Chronic obstructive pulmonary disease, unspecified: Secondary | ICD-10-CM | POA: Diagnosis not present

## 2022-08-27 DIAGNOSIS — J069 Acute upper respiratory infection, unspecified: Secondary | ICD-10-CM | POA: Diagnosis not present

## 2022-08-27 DIAGNOSIS — F17210 Nicotine dependence, cigarettes, uncomplicated: Secondary | ICD-10-CM | POA: Diagnosis not present

## 2022-08-27 DIAGNOSIS — K7460 Unspecified cirrhosis of liver: Secondary | ICD-10-CM | POA: Diagnosis not present

## 2022-08-27 DIAGNOSIS — J449 Chronic obstructive pulmonary disease, unspecified: Secondary | ICD-10-CM | POA: Diagnosis not present

## 2022-08-27 DIAGNOSIS — Z299 Encounter for prophylactic measures, unspecified: Secondary | ICD-10-CM | POA: Diagnosis not present

## 2022-09-08 DIAGNOSIS — J449 Chronic obstructive pulmonary disease, unspecified: Secondary | ICD-10-CM | POA: Diagnosis not present

## 2022-09-08 DIAGNOSIS — J432 Centrilobular emphysema: Secondary | ICD-10-CM | POA: Diagnosis not present

## 2022-09-10 DIAGNOSIS — J432 Centrilobular emphysema: Secondary | ICD-10-CM | POA: Diagnosis not present

## 2022-09-10 DIAGNOSIS — J449 Chronic obstructive pulmonary disease, unspecified: Secondary | ICD-10-CM | POA: Diagnosis not present

## 2022-09-12 NOTE — Progress Notes (Signed)
08/03/22- 71 yoF former and occasional smoker followed by Dr Lamonte Sakai for COPD, Chronic Hypoxic Respiratory Failure, Multiple Lung Nodules, complicated by hx Colon Cancer,  O2 2L sleep - prednisone taper started 07/30/22, Neb albuterol, Trelegy 100, Zithromax TIW,  Called 1/18 reporting 1-2 week exacerbation. Covid vax-no Flu vax-had States productive coughing and chest congestion has been occurring for 2-3 weeks now Husband here [place] on face 3 days ago. Face is sore but no lost teeth. Weak. Sputum mostly white. No blood or fever.Increased cough and dyspnea with exertion. Discussed meds.  09/14/22- 71 yoF former and occasional smoker followed by Dr Lamonte Sakai for COPD, Chronic Hypoxic Respiratory Failure, Multiple Lung Nodules, complicated by hx Colon Cancer,  O2 2L sleep/ - prednisone taper started 07/30/22, Neb albuterol, Trelegy 100, Zithromax TIW, . Covid vax-no Flu vax-had At last ov> finsh zith, pred taper, add doxycycline, see Dr Lamonte Sakai 6 wks -----Pt states she still has some nasal congestion and occ cough with phlegm She had continued maintenance Zithromax every Monday Wednesday Friday.  She cannot tell that this treatment has affected her incidence of infections at all.  We discussed stopping it for observation..  Winter season has been worse with some rhinorrhea.  Coughing less but sputum is white, not green.  Trelegy works about as well as her nebulizer machine.  We discussed using a Flutter device to improve airway clearance.  She continues to depend on oxygen 2 L for sleep. I noted rapid pulse.  Husband says her heart rate on a chronic basis is often rapid like this-asymptomatic. CXR 08/03/22-  IMPRESSION: Chronic hyperinflation and bronchial thickening consistent with COPD. No superimposed acute abnormality.   ROS-see HPI   + = positive Constitutional:    weight loss, night sweats, fevers, chills, fatigue, lassitude. HEENT:    headaches, difficulty swallowing, tooth/dental problems, sore  throat,       sneezing, itching, ear ache, nasal congestion, post nasal drip, snoring CV:    chest pain, orthopnea, PND, swelling in lower extremities, anasarca,                                   dizziness, palpitations Resp:   shortness of breath with exertion or at rest.                productive cough,   non-productive cough, coughing up of blood.              change in color of mucus.  wheezing.   Skin:    rash or lesions. GI:  No-   heartburn, indigestion, abdominal pain, nausea, vomiting, diarrhea,                 change in bowel habits, loss of appetite GU: dysuria, change in color of urine, no urgency or frequency.   flank pain. MS:   joint pain, stiffness, decreased range of motion, back pain. Neuro-     nothing unusual Psych:  change in mood or affect.  depression or anxiety.   memory loss.  OBJ- Physical Exam General- Alert, Oriented, Affect-appropriate, Distress- none acute, +frail,  Skin- rash-none, lesions- none, excoriation- none Lymphadenopathy- none Head- atraumatic            Eyes- Gross vision intact, PERRLA, conjunctivae and secretions clear            Ears- Hearing, canals-normal            Nose- Clear,  no-Septal dev, mucus, polyps, erosion, perforation             Throat- Mallampati II , mucosa clear , drainage- none, tonsils- atrophic, +poor dentition Neck- flexible , trachea midline, no stridor , thyroid nl, carotid no bruit Chest - symmetrical excursion , unlabored           Heart/CV- Rapid 108 RR , no murmur , no gallop  , no rub, nl s1 s2                           - JVD- none , edema- none, stasis changes- none, varices- none           Lung-+few rhonchi unlabored, wheeze- none, cough- none , dullness-none, rub- none           Chest wall-  Abd-  Br/ Gen/ Rectal- Not done, not indicated Extrem- cyanosis- none, clubbing, none, atrophy- none, strength- nl Neuro- grossly intact to observation

## 2022-09-14 ENCOUNTER — Ambulatory Visit: Payer: Medicare Other | Admitting: Internal Medicine

## 2022-09-14 ENCOUNTER — Encounter: Payer: Self-pay | Admitting: Internal Medicine

## 2022-09-14 VITALS — BP 122/78 | HR 108 | Ht 61.0 in | Wt 103.0 lb

## 2022-09-14 DIAGNOSIS — J9611 Chronic respiratory failure with hypoxia: Secondary | ICD-10-CM | POA: Diagnosis not present

## 2022-09-14 DIAGNOSIS — J449 Chronic obstructive pulmonary disease, unspecified: Secondary | ICD-10-CM

## 2022-09-14 NOTE — Patient Instructions (Addendum)
Stop maintenance zithromax and watch for recurrent infection.  Order- Flutter valve- Blow through it 4 blows per set, 3 sets per day   Try this when needed, to help clear mucus from airways.

## 2022-09-16 DIAGNOSIS — J449 Chronic obstructive pulmonary disease, unspecified: Secondary | ICD-10-CM | POA: Diagnosis not present

## 2022-09-22 DIAGNOSIS — Z299 Encounter for prophylactic measures, unspecified: Secondary | ICD-10-CM | POA: Diagnosis not present

## 2022-09-22 DIAGNOSIS — J449 Chronic obstructive pulmonary disease, unspecified: Secondary | ICD-10-CM | POA: Diagnosis not present

## 2022-09-22 DIAGNOSIS — E46 Unspecified protein-calorie malnutrition: Secondary | ICD-10-CM | POA: Diagnosis not present

## 2022-09-22 DIAGNOSIS — Z0000 Encounter for general adult medical examination without abnormal findings: Secondary | ICD-10-CM | POA: Diagnosis not present

## 2022-09-22 DIAGNOSIS — Z681 Body mass index (BMI) 19 or less, adult: Secondary | ICD-10-CM | POA: Diagnosis not present

## 2022-09-22 DIAGNOSIS — F17210 Nicotine dependence, cigarettes, uncomplicated: Secondary | ICD-10-CM | POA: Diagnosis not present

## 2022-09-22 DIAGNOSIS — Z7189 Other specified counseling: Secondary | ICD-10-CM | POA: Diagnosis not present

## 2022-09-22 DIAGNOSIS — I10 Essential (primary) hypertension: Secondary | ICD-10-CM | POA: Diagnosis not present

## 2022-09-30 ENCOUNTER — Encounter: Payer: Self-pay | Admitting: Internal Medicine

## 2022-09-30 NOTE — Assessment & Plan Note (Signed)
She will continue oxygen for sleep.

## 2022-09-30 NOTE — Assessment & Plan Note (Signed)
Fair control, probably closer to her baseline.  Maintenance Zithromax has not seem to affect incidence of infections.  We have educated on use of a flutter device for airway clearance. Plan-flutter device, stop Zithromax maintenance suppression therapy.

## 2022-10-07 DIAGNOSIS — J449 Chronic obstructive pulmonary disease, unspecified: Secondary | ICD-10-CM | POA: Diagnosis not present

## 2022-10-07 DIAGNOSIS — J432 Centrilobular emphysema: Secondary | ICD-10-CM | POA: Diagnosis not present

## 2022-10-09 DIAGNOSIS — J432 Centrilobular emphysema: Secondary | ICD-10-CM | POA: Diagnosis not present

## 2022-10-09 DIAGNOSIS — J449 Chronic obstructive pulmonary disease, unspecified: Secondary | ICD-10-CM | POA: Diagnosis not present

## 2022-11-07 DIAGNOSIS — J432 Centrilobular emphysema: Secondary | ICD-10-CM | POA: Diagnosis not present

## 2022-11-07 DIAGNOSIS — J449 Chronic obstructive pulmonary disease, unspecified: Secondary | ICD-10-CM | POA: Diagnosis not present

## 2022-11-09 DIAGNOSIS — J432 Centrilobular emphysema: Secondary | ICD-10-CM | POA: Diagnosis not present

## 2022-11-09 DIAGNOSIS — J449 Chronic obstructive pulmonary disease, unspecified: Secondary | ICD-10-CM | POA: Diagnosis not present

## 2022-11-16 DIAGNOSIS — Z7901 Long term (current) use of anticoagulants: Secondary | ICD-10-CM | POA: Diagnosis not present

## 2022-11-16 DIAGNOSIS — Z8616 Personal history of COVID-19: Secondary | ICD-10-CM | POA: Diagnosis not present

## 2022-11-16 DIAGNOSIS — Z803 Family history of malignant neoplasm of breast: Secondary | ICD-10-CM | POA: Diagnosis not present

## 2022-11-16 DIAGNOSIS — J9621 Acute and chronic respiratory failure with hypoxia: Secondary | ICD-10-CM | POA: Diagnosis not present

## 2022-11-16 DIAGNOSIS — J90 Pleural effusion, not elsewhere classified: Secondary | ICD-10-CM | POA: Diagnosis not present

## 2022-11-16 DIAGNOSIS — E7800 Pure hypercholesterolemia, unspecified: Secondary | ICD-10-CM | POA: Diagnosis not present

## 2022-11-16 DIAGNOSIS — I071 Rheumatic tricuspid insufficiency: Secondary | ICD-10-CM | POA: Diagnosis not present

## 2022-11-16 DIAGNOSIS — Z0389 Encounter for observation for other suspected diseases and conditions ruled out: Secondary | ICD-10-CM | POA: Diagnosis not present

## 2022-11-16 DIAGNOSIS — M6281 Muscle weakness (generalized): Secondary | ICD-10-CM | POA: Diagnosis not present

## 2022-11-16 DIAGNOSIS — R0902 Hypoxemia: Secondary | ICD-10-CM | POA: Diagnosis not present

## 2022-11-16 DIAGNOSIS — Z681 Body mass index (BMI) 19 or less, adult: Secondary | ICD-10-CM | POA: Diagnosis not present

## 2022-11-16 DIAGNOSIS — R2689 Other abnormalities of gait and mobility: Secondary | ICD-10-CM | POA: Diagnosis not present

## 2022-11-16 DIAGNOSIS — J449 Chronic obstructive pulmonary disease, unspecified: Secondary | ICD-10-CM | POA: Diagnosis not present

## 2022-11-16 DIAGNOSIS — J9611 Chronic respiratory failure with hypoxia: Secondary | ICD-10-CM | POA: Diagnosis not present

## 2022-11-16 DIAGNOSIS — R5381 Other malaise: Secondary | ICD-10-CM | POA: Diagnosis not present

## 2022-11-16 DIAGNOSIS — E46 Unspecified protein-calorie malnutrition: Secondary | ICD-10-CM | POA: Diagnosis not present

## 2022-11-16 DIAGNOSIS — S42352A Displaced comminuted fracture of shaft of humerus, left arm, initial encounter for closed fracture: Secondary | ICD-10-CM | POA: Diagnosis not present

## 2022-11-16 DIAGNOSIS — I272 Pulmonary hypertension, unspecified: Secondary | ICD-10-CM | POA: Diagnosis not present

## 2022-11-16 DIAGNOSIS — F1090 Alcohol use, unspecified, uncomplicated: Secondary | ICD-10-CM | POA: Diagnosis not present

## 2022-11-16 DIAGNOSIS — N281 Cyst of kidney, acquired: Secondary | ICD-10-CM | POA: Diagnosis not present

## 2022-11-16 DIAGNOSIS — S42352D Displaced comminuted fracture of shaft of humerus, left arm, subsequent encounter for fracture with routine healing: Secondary | ICD-10-CM | POA: Diagnosis not present

## 2022-11-16 DIAGNOSIS — S42295A Other nondisplaced fracture of upper end of left humerus, initial encounter for closed fracture: Secondary | ICD-10-CM | POA: Diagnosis not present

## 2022-11-16 DIAGNOSIS — Z886 Allergy status to analgesic agent status: Secondary | ICD-10-CM | POA: Diagnosis not present

## 2022-11-16 DIAGNOSIS — R54 Age-related physical debility: Secondary | ICD-10-CM | POA: Diagnosis not present

## 2022-11-16 DIAGNOSIS — S2242XD Multiple fractures of ribs, left side, subsequent encounter for fracture with routine healing: Secondary | ICD-10-CM | POA: Diagnosis not present

## 2022-11-16 DIAGNOSIS — J440 Chronic obstructive pulmonary disease with acute lower respiratory infection: Secondary | ICD-10-CM | POA: Diagnosis not present

## 2022-11-16 DIAGNOSIS — I959 Hypotension, unspecified: Secondary | ICD-10-CM | POA: Diagnosis not present

## 2022-11-16 DIAGNOSIS — R918 Other nonspecific abnormal finding of lung field: Secondary | ICD-10-CM | POA: Diagnosis not present

## 2022-11-16 DIAGNOSIS — M79602 Pain in left arm: Secondary | ICD-10-CM | POA: Diagnosis not present

## 2022-11-16 DIAGNOSIS — W19XXXD Unspecified fall, subsequent encounter: Secondary | ICD-10-CM | POA: Diagnosis not present

## 2022-11-16 DIAGNOSIS — Z9981 Dependence on supplemental oxygen: Secondary | ICD-10-CM | POA: Diagnosis not present

## 2022-11-16 DIAGNOSIS — Z01818 Encounter for other preprocedural examination: Secondary | ICD-10-CM | POA: Diagnosis not present

## 2022-11-16 DIAGNOSIS — W19XXXA Unspecified fall, initial encounter: Secondary | ICD-10-CM | POA: Diagnosis not present

## 2022-11-16 DIAGNOSIS — Z299 Encounter for prophylactic measures, unspecified: Secondary | ICD-10-CM | POA: Diagnosis not present

## 2022-11-16 DIAGNOSIS — Z800 Family history of malignant neoplasm of digestive organs: Secondary | ICD-10-CM | POA: Diagnosis not present

## 2022-11-16 DIAGNOSIS — F17210 Nicotine dependence, cigarettes, uncomplicated: Secondary | ICD-10-CM | POA: Diagnosis not present

## 2022-11-16 DIAGNOSIS — J441 Chronic obstructive pulmonary disease with (acute) exacerbation: Secondary | ICD-10-CM | POA: Diagnosis not present

## 2022-11-16 DIAGNOSIS — Z743 Need for continuous supervision: Secondary | ICD-10-CM | POA: Diagnosis not present

## 2022-11-16 DIAGNOSIS — S2242XA Multiple fractures of ribs, left side, initial encounter for closed fracture: Secondary | ICD-10-CM | POA: Diagnosis not present

## 2022-11-16 DIAGNOSIS — R296 Repeated falls: Secondary | ICD-10-CM | POA: Diagnosis not present

## 2022-11-16 DIAGNOSIS — Z801 Family history of malignant neoplasm of trachea, bronchus and lung: Secondary | ICD-10-CM | POA: Diagnosis not present

## 2022-11-16 DIAGNOSIS — E876 Hypokalemia: Secondary | ICD-10-CM | POA: Diagnosis not present

## 2022-11-16 DIAGNOSIS — I10 Essential (primary) hypertension: Secondary | ICD-10-CM | POA: Diagnosis not present

## 2022-11-16 DIAGNOSIS — S42212A Unspecified displaced fracture of surgical neck of left humerus, initial encounter for closed fracture: Secondary | ICD-10-CM | POA: Diagnosis not present

## 2022-11-16 DIAGNOSIS — E43 Unspecified severe protein-calorie malnutrition: Secondary | ICD-10-CM | POA: Diagnosis not present

## 2022-11-16 DIAGNOSIS — S42301A Unspecified fracture of shaft of humerus, right arm, initial encounter for closed fracture: Secondary | ICD-10-CM | POA: Diagnosis not present

## 2022-11-16 DIAGNOSIS — Z79899 Other long term (current) drug therapy: Secondary | ICD-10-CM | POA: Diagnosis not present

## 2022-11-16 DIAGNOSIS — S42202A Unspecified fracture of upper end of left humerus, initial encounter for closed fracture: Secondary | ICD-10-CM | POA: Diagnosis not present

## 2022-11-16 DIAGNOSIS — R279 Unspecified lack of coordination: Secondary | ICD-10-CM | POA: Diagnosis not present

## 2022-11-16 DIAGNOSIS — Z792 Long term (current) use of antibiotics: Secondary | ICD-10-CM | POA: Diagnosis not present

## 2022-11-16 DIAGNOSIS — R079 Chest pain, unspecified: Secondary | ICD-10-CM | POA: Diagnosis not present

## 2022-11-17 DIAGNOSIS — N838 Other noninflammatory disorders of ovary, fallopian tube and broad ligament: Secondary | ICD-10-CM | POA: Insufficient documentation

## 2022-11-24 DIAGNOSIS — R5381 Other malaise: Secondary | ICD-10-CM | POA: Diagnosis not present

## 2022-11-24 DIAGNOSIS — J432 Centrilobular emphysema: Secondary | ICD-10-CM | POA: Diagnosis not present

## 2022-11-24 DIAGNOSIS — M6281 Muscle weakness (generalized): Secondary | ICD-10-CM | POA: Diagnosis not present

## 2022-11-24 DIAGNOSIS — Z743 Need for continuous supervision: Secondary | ICD-10-CM | POA: Diagnosis not present

## 2022-11-24 DIAGNOSIS — S42352D Displaced comminuted fracture of shaft of humerus, left arm, subsequent encounter for fracture with routine healing: Secondary | ICD-10-CM | POA: Diagnosis not present

## 2022-11-24 DIAGNOSIS — R279 Unspecified lack of coordination: Secondary | ICD-10-CM | POA: Diagnosis not present

## 2022-11-24 DIAGNOSIS — E43 Unspecified severe protein-calorie malnutrition: Secondary | ICD-10-CM | POA: Insufficient documentation

## 2022-11-24 DIAGNOSIS — G319 Degenerative disease of nervous system, unspecified: Secondary | ICD-10-CM | POA: Diagnosis not present

## 2022-11-24 DIAGNOSIS — R296 Repeated falls: Secondary | ICD-10-CM | POA: Diagnosis not present

## 2022-11-24 DIAGNOSIS — R2689 Other abnormalities of gait and mobility: Secondary | ICD-10-CM | POA: Diagnosis not present

## 2022-11-24 DIAGNOSIS — J9611 Chronic respiratory failure with hypoxia: Secondary | ICD-10-CM | POA: Diagnosis not present

## 2022-11-24 DIAGNOSIS — K219 Gastro-esophageal reflux disease without esophagitis: Secondary | ICD-10-CM | POA: Diagnosis not present

## 2022-11-24 DIAGNOSIS — J441 Chronic obstructive pulmonary disease with (acute) exacerbation: Secondary | ICD-10-CM | POA: Diagnosis not present

## 2022-11-24 DIAGNOSIS — S2242XD Multiple fractures of ribs, left side, subsequent encounter for fracture with routine healing: Secondary | ICD-10-CM | POA: Diagnosis not present

## 2022-11-24 DIAGNOSIS — Z9981 Dependence on supplemental oxygen: Secondary | ICD-10-CM | POA: Diagnosis not present

## 2022-11-24 DIAGNOSIS — I10 Essential (primary) hypertension: Secondary | ICD-10-CM | POA: Diagnosis not present

## 2022-11-24 DIAGNOSIS — E46 Unspecified protein-calorie malnutrition: Secondary | ICD-10-CM | POA: Diagnosis not present

## 2022-11-24 DIAGNOSIS — J449 Chronic obstructive pulmonary disease, unspecified: Secondary | ICD-10-CM | POA: Diagnosis not present

## 2022-11-24 DIAGNOSIS — Z299 Encounter for prophylactic measures, unspecified: Secondary | ICD-10-CM | POA: Diagnosis not present

## 2022-11-24 DIAGNOSIS — R059 Cough, unspecified: Secondary | ICD-10-CM | POA: Diagnosis not present

## 2022-11-24 DIAGNOSIS — I700 Atherosclerosis of aorta: Secondary | ICD-10-CM | POA: Diagnosis not present

## 2022-11-24 DIAGNOSIS — W19XXXD Unspecified fall, subsequent encounter: Secondary | ICD-10-CM | POA: Diagnosis not present

## 2022-11-26 DIAGNOSIS — I10 Essential (primary) hypertension: Secondary | ICD-10-CM | POA: Diagnosis not present

## 2022-11-26 DIAGNOSIS — E46 Unspecified protein-calorie malnutrition: Secondary | ICD-10-CM | POA: Diagnosis not present

## 2022-11-26 DIAGNOSIS — Z299 Encounter for prophylactic measures, unspecified: Secondary | ICD-10-CM | POA: Diagnosis not present

## 2022-11-26 DIAGNOSIS — I700 Atherosclerosis of aorta: Secondary | ICD-10-CM | POA: Diagnosis not present

## 2022-12-01 DIAGNOSIS — Z299 Encounter for prophylactic measures, unspecified: Secondary | ICD-10-CM | POA: Diagnosis not present

## 2022-12-01 DIAGNOSIS — J441 Chronic obstructive pulmonary disease with (acute) exacerbation: Secondary | ICD-10-CM | POA: Diagnosis not present

## 2022-12-01 DIAGNOSIS — K219 Gastro-esophageal reflux disease without esophagitis: Secondary | ICD-10-CM | POA: Diagnosis not present

## 2022-12-09 DIAGNOSIS — S42352D Displaced comminuted fracture of shaft of humerus, left arm, subsequent encounter for fracture with routine healing: Secondary | ICD-10-CM | POA: Diagnosis not present

## 2022-12-09 DIAGNOSIS — J9611 Chronic respiratory failure with hypoxia: Secondary | ICD-10-CM | POA: Diagnosis not present

## 2022-12-09 DIAGNOSIS — S2242XD Multiple fractures of ribs, left side, subsequent encounter for fracture with routine healing: Secondary | ICD-10-CM | POA: Diagnosis not present

## 2022-12-09 DIAGNOSIS — R296 Repeated falls: Secondary | ICD-10-CM | POA: Diagnosis not present

## 2022-12-09 DIAGNOSIS — J449 Chronic obstructive pulmonary disease, unspecified: Secondary | ICD-10-CM | POA: Diagnosis not present

## 2022-12-10 DIAGNOSIS — J9611 Chronic respiratory failure with hypoxia: Secondary | ICD-10-CM | POA: Diagnosis not present

## 2022-12-10 DIAGNOSIS — G319 Degenerative disease of nervous system, unspecified: Secondary | ICD-10-CM | POA: Diagnosis not present

## 2022-12-10 DIAGNOSIS — I10 Essential (primary) hypertension: Secondary | ICD-10-CM | POA: Diagnosis not present

## 2022-12-10 DIAGNOSIS — Z299 Encounter for prophylactic measures, unspecified: Secondary | ICD-10-CM | POA: Diagnosis not present

## 2022-12-12 DIAGNOSIS — S42212D Unspecified displaced fracture of surgical neck of left humerus, subsequent encounter for fracture with routine healing: Secondary | ICD-10-CM | POA: Diagnosis not present

## 2022-12-12 DIAGNOSIS — S42402D Unspecified fracture of lower end of left humerus, subsequent encounter for fracture with routine healing: Secondary | ICD-10-CM | POA: Diagnosis not present

## 2022-12-12 DIAGNOSIS — S2242XD Multiple fractures of ribs, left side, subsequent encounter for fracture with routine healing: Secondary | ICD-10-CM | POA: Diagnosis not present

## 2022-12-12 DIAGNOSIS — S42252D Displaced fracture of greater tuberosity of left humerus, subsequent encounter for fracture with routine healing: Secondary | ICD-10-CM | POA: Diagnosis not present

## 2022-12-12 DIAGNOSIS — S42352D Displaced comminuted fracture of shaft of humerus, left arm, subsequent encounter for fracture with routine healing: Secondary | ICD-10-CM | POA: Diagnosis not present

## 2022-12-14 DIAGNOSIS — S42212D Unspecified displaced fracture of surgical neck of left humerus, subsequent encounter for fracture with routine healing: Secondary | ICD-10-CM | POA: Diagnosis not present

## 2022-12-14 DIAGNOSIS — S42252D Displaced fracture of greater tuberosity of left humerus, subsequent encounter for fracture with routine healing: Secondary | ICD-10-CM | POA: Diagnosis not present

## 2022-12-14 DIAGNOSIS — S42402D Unspecified fracture of lower end of left humerus, subsequent encounter for fracture with routine healing: Secondary | ICD-10-CM | POA: Diagnosis not present

## 2022-12-14 DIAGNOSIS — S2242XD Multiple fractures of ribs, left side, subsequent encounter for fracture with routine healing: Secondary | ICD-10-CM | POA: Diagnosis not present

## 2022-12-14 DIAGNOSIS — S42352D Displaced comminuted fracture of shaft of humerus, left arm, subsequent encounter for fracture with routine healing: Secondary | ICD-10-CM | POA: Diagnosis not present

## 2022-12-15 DIAGNOSIS — I10 Essential (primary) hypertension: Secondary | ICD-10-CM | POA: Diagnosis not present

## 2022-12-15 DIAGNOSIS — K219 Gastro-esophageal reflux disease without esophagitis: Secondary | ICD-10-CM | POA: Diagnosis not present

## 2022-12-15 DIAGNOSIS — F17210 Nicotine dependence, cigarettes, uncomplicated: Secondary | ICD-10-CM | POA: Diagnosis not present

## 2022-12-15 DIAGNOSIS — J449 Chronic obstructive pulmonary disease, unspecified: Secondary | ICD-10-CM | POA: Diagnosis not present

## 2022-12-15 DIAGNOSIS — Z299 Encounter for prophylactic measures, unspecified: Secondary | ICD-10-CM | POA: Diagnosis not present

## 2022-12-16 DIAGNOSIS — S42352D Displaced comminuted fracture of shaft of humerus, left arm, subsequent encounter for fracture with routine healing: Secondary | ICD-10-CM | POA: Diagnosis not present

## 2022-12-16 DIAGNOSIS — S42402D Unspecified fracture of lower end of left humerus, subsequent encounter for fracture with routine healing: Secondary | ICD-10-CM | POA: Diagnosis not present

## 2022-12-16 DIAGNOSIS — S42252D Displaced fracture of greater tuberosity of left humerus, subsequent encounter for fracture with routine healing: Secondary | ICD-10-CM | POA: Diagnosis not present

## 2022-12-16 DIAGNOSIS — S42212D Unspecified displaced fracture of surgical neck of left humerus, subsequent encounter for fracture with routine healing: Secondary | ICD-10-CM | POA: Diagnosis not present

## 2022-12-16 DIAGNOSIS — S2242XD Multiple fractures of ribs, left side, subsequent encounter for fracture with routine healing: Secondary | ICD-10-CM | POA: Diagnosis not present

## 2022-12-17 DIAGNOSIS — J9611 Chronic respiratory failure with hypoxia: Secondary | ICD-10-CM | POA: Diagnosis not present

## 2022-12-17 DIAGNOSIS — J449 Chronic obstructive pulmonary disease, unspecified: Secondary | ICD-10-CM | POA: Diagnosis not present

## 2022-12-17 DIAGNOSIS — Z515 Encounter for palliative care: Secondary | ICD-10-CM | POA: Diagnosis not present

## 2022-12-21 DIAGNOSIS — S42362D Displaced segmental fracture of shaft of humerus, left arm, subsequent encounter for fracture with routine healing: Secondary | ICD-10-CM | POA: Diagnosis not present

## 2022-12-21 DIAGNOSIS — S42212D Unspecified displaced fracture of surgical neck of left humerus, subsequent encounter for fracture with routine healing: Secondary | ICD-10-CM | POA: Diagnosis not present

## 2022-12-21 DIAGNOSIS — S42402D Unspecified fracture of lower end of left humerus, subsequent encounter for fracture with routine healing: Secondary | ICD-10-CM | POA: Diagnosis not present

## 2022-12-21 DIAGNOSIS — S42252D Displaced fracture of greater tuberosity of left humerus, subsequent encounter for fracture with routine healing: Secondary | ICD-10-CM | POA: Diagnosis not present

## 2022-12-22 DIAGNOSIS — S42212D Unspecified displaced fracture of surgical neck of left humerus, subsequent encounter for fracture with routine healing: Secondary | ICD-10-CM | POA: Diagnosis not present

## 2022-12-22 DIAGNOSIS — S2242XD Multiple fractures of ribs, left side, subsequent encounter for fracture with routine healing: Secondary | ICD-10-CM | POA: Diagnosis not present

## 2022-12-22 DIAGNOSIS — S42352D Displaced comminuted fracture of shaft of humerus, left arm, subsequent encounter for fracture with routine healing: Secondary | ICD-10-CM | POA: Diagnosis not present

## 2022-12-22 DIAGNOSIS — S42252D Displaced fracture of greater tuberosity of left humerus, subsequent encounter for fracture with routine healing: Secondary | ICD-10-CM | POA: Diagnosis not present

## 2022-12-22 DIAGNOSIS — S42402D Unspecified fracture of lower end of left humerus, subsequent encounter for fracture with routine healing: Secondary | ICD-10-CM | POA: Diagnosis not present

## 2022-12-23 DIAGNOSIS — S42252D Displaced fracture of greater tuberosity of left humerus, subsequent encounter for fracture with routine healing: Secondary | ICD-10-CM | POA: Diagnosis not present

## 2022-12-23 DIAGNOSIS — S42402D Unspecified fracture of lower end of left humerus, subsequent encounter for fracture with routine healing: Secondary | ICD-10-CM | POA: Diagnosis not present

## 2022-12-23 DIAGNOSIS — S42212D Unspecified displaced fracture of surgical neck of left humerus, subsequent encounter for fracture with routine healing: Secondary | ICD-10-CM | POA: Diagnosis not present

## 2022-12-23 DIAGNOSIS — S2242XD Multiple fractures of ribs, left side, subsequent encounter for fracture with routine healing: Secondary | ICD-10-CM | POA: Diagnosis not present

## 2022-12-23 DIAGNOSIS — S42352D Displaced comminuted fracture of shaft of humerus, left arm, subsequent encounter for fracture with routine healing: Secondary | ICD-10-CM | POA: Diagnosis not present

## 2022-12-24 DIAGNOSIS — S42352D Displaced comminuted fracture of shaft of humerus, left arm, subsequent encounter for fracture with routine healing: Secondary | ICD-10-CM | POA: Diagnosis not present

## 2022-12-24 DIAGNOSIS — S2242XD Multiple fractures of ribs, left side, subsequent encounter for fracture with routine healing: Secondary | ICD-10-CM | POA: Diagnosis not present

## 2022-12-24 DIAGNOSIS — S42212D Unspecified displaced fracture of surgical neck of left humerus, subsequent encounter for fracture with routine healing: Secondary | ICD-10-CM | POA: Diagnosis not present

## 2022-12-24 DIAGNOSIS — S42252D Displaced fracture of greater tuberosity of left humerus, subsequent encounter for fracture with routine healing: Secondary | ICD-10-CM | POA: Diagnosis not present

## 2022-12-24 DIAGNOSIS — S42402D Unspecified fracture of lower end of left humerus, subsequent encounter for fracture with routine healing: Secondary | ICD-10-CM | POA: Diagnosis not present

## 2022-12-29 DIAGNOSIS — S2242XD Multiple fractures of ribs, left side, subsequent encounter for fracture with routine healing: Secondary | ICD-10-CM | POA: Diagnosis not present

## 2022-12-29 DIAGNOSIS — S42402D Unspecified fracture of lower end of left humerus, subsequent encounter for fracture with routine healing: Secondary | ICD-10-CM | POA: Diagnosis not present

## 2022-12-29 DIAGNOSIS — S42252D Displaced fracture of greater tuberosity of left humerus, subsequent encounter for fracture with routine healing: Secondary | ICD-10-CM | POA: Diagnosis not present

## 2022-12-29 DIAGNOSIS — S42212D Unspecified displaced fracture of surgical neck of left humerus, subsequent encounter for fracture with routine healing: Secondary | ICD-10-CM | POA: Diagnosis not present

## 2022-12-29 DIAGNOSIS — S42352D Displaced comminuted fracture of shaft of humerus, left arm, subsequent encounter for fracture with routine healing: Secondary | ICD-10-CM | POA: Diagnosis not present

## 2022-12-30 DIAGNOSIS — S2242XD Multiple fractures of ribs, left side, subsequent encounter for fracture with routine healing: Secondary | ICD-10-CM | POA: Diagnosis not present

## 2022-12-30 DIAGNOSIS — S42212D Unspecified displaced fracture of surgical neck of left humerus, subsequent encounter for fracture with routine healing: Secondary | ICD-10-CM | POA: Diagnosis not present

## 2022-12-30 DIAGNOSIS — S42352D Displaced comminuted fracture of shaft of humerus, left arm, subsequent encounter for fracture with routine healing: Secondary | ICD-10-CM | POA: Diagnosis not present

## 2022-12-30 DIAGNOSIS — S42252D Displaced fracture of greater tuberosity of left humerus, subsequent encounter for fracture with routine healing: Secondary | ICD-10-CM | POA: Diagnosis not present

## 2022-12-30 DIAGNOSIS — S42402D Unspecified fracture of lower end of left humerus, subsequent encounter for fracture with routine healing: Secondary | ICD-10-CM | POA: Diagnosis not present

## 2022-12-31 ENCOUNTER — Encounter (HOSPITAL_BASED_OUTPATIENT_CLINIC_OR_DEPARTMENT_OTHER): Payer: Self-pay | Admitting: Pulmonary Disease

## 2022-12-31 ENCOUNTER — Ambulatory Visit (HOSPITAL_BASED_OUTPATIENT_CLINIC_OR_DEPARTMENT_OTHER): Payer: Medicare Other | Admitting: Pulmonary Disease

## 2022-12-31 VITALS — BP 128/82 | HR 88 | Ht 61.0 in | Wt 93.2 lb

## 2022-12-31 DIAGNOSIS — J449 Chronic obstructive pulmonary disease, unspecified: Secondary | ICD-10-CM | POA: Diagnosis not present

## 2022-12-31 DIAGNOSIS — J432 Centrilobular emphysema: Secondary | ICD-10-CM

## 2022-12-31 DIAGNOSIS — R64 Cachexia: Secondary | ICD-10-CM

## 2022-12-31 DIAGNOSIS — J9611 Chronic respiratory failure with hypoxia: Secondary | ICD-10-CM | POA: Diagnosis not present

## 2022-12-31 NOTE — Patient Instructions (Signed)
Use trelegy every day.  Monitor your oxygen level at home while doing different activities.  Your goal is to have your oxygen above 90%.  Follow up in 4 weeks with Dr. Delton Coombes or a Nurse Practitioner.

## 2022-12-31 NOTE — Progress Notes (Signed)
[facility]  Name: Maria Stevens MRN: [id number] DOB: 12/23/1950  Chief Complaint  Patient presents with   Acute Visit    Increased SOB over the past 1 week. States the SOB is causing her to have panic attacks.     Summary: 72 yo female former smoker with COPD and chronic respiratory failure followed by Dr. Delton Coombes.  Subjective: She feel in May.  Admitted at [facility].  Found to have comminuted, angulated and impacted Lt humeral head and neck fracture.  Wasn't a candidate for surgery.  She was in hospital for a week.  She didn't have much appetite and lost about 20 lbs.  CT chest showed total ATX  of the LLL with extensive plugging, complete ATX of the RML with air bronchograms.  She was treated with rocephin and doxycycline for pneumonia.    Since coming home she has not been able to do much activity.  She gets short of breath and then feels panicked.  She hasn't been using her trelegy on a regular basis.  Not having much cough, sputum, or wheeze.  Her legs feel weak.  She uses 3 liters oxygen 24/7.  She has a POC with pulsed oxygen.  Past medical history: She  has a past medical history of Colon cancer (HCC), Emphysema of lung (HCC), and Hypertension.  Vital signs: BP 128/82   Pulse 88   Ht 5\' 1"  (1.549 m)   Wt 93 lb 2.7 oz (42.3 kg)   SpO2 96% Comment: on 3L  BMI 17.60 kg/m   Physical exam:  General - thin, frail, decreased muscle bulk, wearing oxygen, sitting in a wheelchair ENT - no sinus tenderness, no stridor Cardiac - regular rate/rhythm, no murmur Chest - decreased breath sounds, no wheeze Extremities - no cyanosis, clubbing, or edema Skin - no rashes Psych - normal mood and behavior  Assessment/plan:  Severe COPD with emphysema and recent pneumonia. COPD cachexia. - suspect her current symptoms are related to recent pneumonia with weight loss and deconditioning and confusion about her inhaler regimen - reviewed the roles of her different  inhalers - discussed importance of maintaining her protein and caloric intake - goal SpO2 > 90%; she is to monitor her oxygen level with activity - discussed importance of maintaining regular activity level as tolerated - she will need follow up chest xray at her next visit  Patient Instructions  Use trelegy every day.  Monitor your oxygen level at home while doing different activities.  Your goal is to have your oxygen above 90%.  Follow up in 4 weeks with Dr. Delton Coombes or a Nurse Practitioner.  Allergies as of 12/31/2022       Reactions   Aspirin Nausea Only        Medication List        Accurate as of December 31, 2022  4:54 PM. If you have any questions, ask your nurse or doctor.          STOP taking these medications    ALPRAZolam 1 MG tablet Commonly known as: XANAX Stopped by: Coralyn Helling, MD   azithromycin 250 MG tablet Commonly known as: ZITHROMAX Stopped by: Coralyn Helling, MD       TAKE these medications    albuterol (5 MG/ML) 0.5% nebulizer solution Commonly known as: PROVENTIL Take 2.5 mg by nebulization every 6 (six) hours as needed for wheezing or shortness of breath.   calcium carbonate 1500 (600 Ca) MG Tabs tablet Commonly known as:  OSCAL Take by mouth 2 (two) times daily with a meal.   calcium citrate-vitamin D 315-200 MG-UNIT tablet Commonly known as: CITRACAL+D Take 1 tablet by mouth 2 (two) times daily.   ergocalciferol 1.25 MG (50000 UT) capsule Commonly known as: VITAMIN D2 Take 50,000 Units by mouth once a week.   famotidine 40 MG tablet Commonly known as: PEPCID Take 40 mg by mouth daily.   FLUoxetine 40 MG capsule Commonly known as: PROZAC Take 40 mg by mouth daily.   Fluticasone-Umeclidin-Vilant 100-62.5-25 MCG/INH Aepb Inhale 1 puff into the lungs daily.   ipratropium-albuterol 0.5-2.5 (3) MG/3ML Soln Commonly known as: DUONEB Take 3 mLs by nebulization every 6 (six) hours as needed.   lisinopril 10 MG tablet Commonly known  as: ZESTRIL Take 10 mg by mouth daily.   rosuvastatin 10 MG tablet Commonly known as: CRESTOR Take 10 mg by mouth daily.        Time spent: 38 minutes  Signature: Coralyn Helling, MD [facility] Pager - [phone] 12/31/2022, 4:54 PM

## 2023-01-01 DIAGNOSIS — S2242XD Multiple fractures of ribs, left side, subsequent encounter for fracture with routine healing: Secondary | ICD-10-CM | POA: Diagnosis not present

## 2023-01-01 DIAGNOSIS — S42352D Displaced comminuted fracture of shaft of humerus, left arm, subsequent encounter for fracture with routine healing: Secondary | ICD-10-CM | POA: Diagnosis not present

## 2023-01-01 DIAGNOSIS — S42252D Displaced fracture of greater tuberosity of left humerus, subsequent encounter for fracture with routine healing: Secondary | ICD-10-CM | POA: Diagnosis not present

## 2023-01-01 DIAGNOSIS — S42402D Unspecified fracture of lower end of left humerus, subsequent encounter for fracture with routine healing: Secondary | ICD-10-CM | POA: Diagnosis not present

## 2023-01-01 DIAGNOSIS — S42212D Unspecified displaced fracture of surgical neck of left humerus, subsequent encounter for fracture with routine healing: Secondary | ICD-10-CM | POA: Diagnosis not present

## 2023-01-05 DIAGNOSIS — S2242XD Multiple fractures of ribs, left side, subsequent encounter for fracture with routine healing: Secondary | ICD-10-CM | POA: Diagnosis not present

## 2023-01-05 DIAGNOSIS — S42252D Displaced fracture of greater tuberosity of left humerus, subsequent encounter for fracture with routine healing: Secondary | ICD-10-CM | POA: Diagnosis not present

## 2023-01-05 DIAGNOSIS — S42402D Unspecified fracture of lower end of left humerus, subsequent encounter for fracture with routine healing: Secondary | ICD-10-CM | POA: Diagnosis not present

## 2023-01-05 DIAGNOSIS — S42352D Displaced comminuted fracture of shaft of humerus, left arm, subsequent encounter for fracture with routine healing: Secondary | ICD-10-CM | POA: Diagnosis not present

## 2023-01-05 DIAGNOSIS — S42212D Unspecified displaced fracture of surgical neck of left humerus, subsequent encounter for fracture with routine healing: Secondary | ICD-10-CM | POA: Diagnosis not present

## 2023-01-07 DIAGNOSIS — J432 Centrilobular emphysema: Secondary | ICD-10-CM | POA: Diagnosis not present

## 2023-01-07 DIAGNOSIS — J449 Chronic obstructive pulmonary disease, unspecified: Secondary | ICD-10-CM | POA: Diagnosis not present

## 2023-01-08 DIAGNOSIS — S42252D Displaced fracture of greater tuberosity of left humerus, subsequent encounter for fracture with routine healing: Secondary | ICD-10-CM | POA: Diagnosis not present

## 2023-01-08 DIAGNOSIS — S42402D Unspecified fracture of lower end of left humerus, subsequent encounter for fracture with routine healing: Secondary | ICD-10-CM | POA: Diagnosis not present

## 2023-01-08 DIAGNOSIS — S42212D Unspecified displaced fracture of surgical neck of left humerus, subsequent encounter for fracture with routine healing: Secondary | ICD-10-CM | POA: Diagnosis not present

## 2023-01-08 DIAGNOSIS — S42352D Displaced comminuted fracture of shaft of humerus, left arm, subsequent encounter for fracture with routine healing: Secondary | ICD-10-CM | POA: Diagnosis not present

## 2023-01-08 DIAGNOSIS — S2242XD Multiple fractures of ribs, left side, subsequent encounter for fracture with routine healing: Secondary | ICD-10-CM | POA: Diagnosis not present

## 2023-01-09 DIAGNOSIS — S2242XD Multiple fractures of ribs, left side, subsequent encounter for fracture with routine healing: Secondary | ICD-10-CM | POA: Diagnosis not present

## 2023-01-09 DIAGNOSIS — J9611 Chronic respiratory failure with hypoxia: Secondary | ICD-10-CM | POA: Diagnosis not present

## 2023-01-09 DIAGNOSIS — R296 Repeated falls: Secondary | ICD-10-CM | POA: Diagnosis not present

## 2023-01-09 DIAGNOSIS — S42352D Displaced comminuted fracture of shaft of humerus, left arm, subsequent encounter for fracture with routine healing: Secondary | ICD-10-CM | POA: Diagnosis not present

## 2023-01-09 DIAGNOSIS — J449 Chronic obstructive pulmonary disease, unspecified: Secondary | ICD-10-CM | POA: Diagnosis not present

## 2023-01-09 DIAGNOSIS — J432 Centrilobular emphysema: Secondary | ICD-10-CM | POA: Diagnosis not present

## 2023-01-13 DIAGNOSIS — S42212D Unspecified displaced fracture of surgical neck of left humerus, subsequent encounter for fracture with routine healing: Secondary | ICD-10-CM | POA: Diagnosis not present

## 2023-01-13 DIAGNOSIS — S42352D Displaced comminuted fracture of shaft of humerus, left arm, subsequent encounter for fracture with routine healing: Secondary | ICD-10-CM | POA: Diagnosis not present

## 2023-01-13 DIAGNOSIS — S2242XD Multiple fractures of ribs, left side, subsequent encounter for fracture with routine healing: Secondary | ICD-10-CM | POA: Diagnosis not present

## 2023-01-13 DIAGNOSIS — S42402D Unspecified fracture of lower end of left humerus, subsequent encounter for fracture with routine healing: Secondary | ICD-10-CM | POA: Diagnosis not present

## 2023-01-13 DIAGNOSIS — S42252D Displaced fracture of greater tuberosity of left humerus, subsequent encounter for fracture with routine healing: Secondary | ICD-10-CM | POA: Diagnosis not present

## 2023-01-19 ENCOUNTER — Ambulatory Visit (HOSPITAL_BASED_OUTPATIENT_CLINIC_OR_DEPARTMENT_OTHER): Payer: Medicare Other | Admitting: Adult Health

## 2023-01-22 DIAGNOSIS — J449 Chronic obstructive pulmonary disease, unspecified: Secondary | ICD-10-CM | POA: Diagnosis not present

## 2023-01-22 DIAGNOSIS — J9611 Chronic respiratory failure with hypoxia: Secondary | ICD-10-CM | POA: Diagnosis not present

## 2023-01-22 DIAGNOSIS — Z515 Encounter for palliative care: Secondary | ICD-10-CM | POA: Diagnosis not present

## 2023-01-27 DIAGNOSIS — J9611 Chronic respiratory failure with hypoxia: Secondary | ICD-10-CM | POA: Diagnosis not present

## 2023-01-27 DIAGNOSIS — E46 Unspecified protein-calorie malnutrition: Secondary | ICD-10-CM | POA: Diagnosis not present

## 2023-01-27 DIAGNOSIS — M25512 Pain in left shoulder: Secondary | ICD-10-CM | POA: Diagnosis not present

## 2023-01-27 DIAGNOSIS — J449 Chronic obstructive pulmonary disease, unspecified: Secondary | ICD-10-CM | POA: Diagnosis not present

## 2023-01-27 DIAGNOSIS — Z299 Encounter for prophylactic measures, unspecified: Secondary | ICD-10-CM | POA: Diagnosis not present

## 2023-01-27 DIAGNOSIS — I10 Essential (primary) hypertension: Secondary | ICD-10-CM | POA: Diagnosis not present

## 2023-02-06 DIAGNOSIS — J432 Centrilobular emphysema: Secondary | ICD-10-CM | POA: Diagnosis not present

## 2023-02-06 DIAGNOSIS — J449 Chronic obstructive pulmonary disease, unspecified: Secondary | ICD-10-CM | POA: Diagnosis not present

## 2023-02-08 DIAGNOSIS — R296 Repeated falls: Secondary | ICD-10-CM | POA: Diagnosis not present

## 2023-02-08 DIAGNOSIS — J432 Centrilobular emphysema: Secondary | ICD-10-CM | POA: Diagnosis not present

## 2023-02-08 DIAGNOSIS — J449 Chronic obstructive pulmonary disease, unspecified: Secondary | ICD-10-CM | POA: Diagnosis not present

## 2023-02-08 DIAGNOSIS — J9611 Chronic respiratory failure with hypoxia: Secondary | ICD-10-CM | POA: Diagnosis not present

## 2023-02-08 DIAGNOSIS — S2242XD Multiple fractures of ribs, left side, subsequent encounter for fracture with routine healing: Secondary | ICD-10-CM | POA: Diagnosis not present

## 2023-02-08 DIAGNOSIS — S42352D Displaced comminuted fracture of shaft of humerus, left arm, subsequent encounter for fracture with routine healing: Secondary | ICD-10-CM | POA: Diagnosis not present

## 2023-02-10 DIAGNOSIS — S42202G Unspecified fracture of upper end of left humerus, subsequent encounter for fracture with delayed healing: Secondary | ICD-10-CM | POA: Diagnosis not present

## 2023-02-10 DIAGNOSIS — W19XXXD Unspecified fall, subsequent encounter: Secondary | ICD-10-CM | POA: Diagnosis not present

## 2023-02-10 DIAGNOSIS — M25512 Pain in left shoulder: Secondary | ICD-10-CM | POA: Diagnosis not present

## 2023-02-25 ENCOUNTER — Ambulatory Visit: Payer: Medicare Other | Admitting: Emergency Medicine

## 2023-02-25 ENCOUNTER — Encounter: Payer: Self-pay | Admitting: Emergency Medicine

## 2023-02-25 VITALS — BP 118/76 | HR 95 | Temp 97.3°F | Ht 61.0 in | Wt 88.0 lb

## 2023-02-25 DIAGNOSIS — J9611 Chronic respiratory failure with hypoxia: Secondary | ICD-10-CM | POA: Diagnosis not present

## 2023-02-25 DIAGNOSIS — J449 Chronic obstructive pulmonary disease, unspecified: Secondary | ICD-10-CM | POA: Diagnosis not present

## 2023-02-25 NOTE — Assessment & Plan Note (Signed)
Continue your oxygen at 3 L/min

## 2023-02-25 NOTE — Patient Instructions (Addendum)
Please continue to use your Trelegy once daily.  Rinse and gargle after using. Keep albuterol available to use 2 puffs up to every 4 hours if needed for shortness of breath, chest tightness, wheezing.  Okay to use your DuoNeb as needed for shortness of breath. Continue your oxygen at 3 L/min Follow with APP in 6 months, sooner if you have any problems Follow Dr. Delton Coombes in 1 year

## 2023-02-25 NOTE — Progress Notes (Signed)
Subjective:    Patient ID: Maria Stevens, female    DOB: [birth year], 72 y.o.   MRN: [id number]  HPI  ROV 04/22/22 --72 year old woman with history of former tobacco and associated COPD, chronic hypoxemic respiratory failure.  I saw her for this as well as multiple scattered cavitary nodules on CT scan of the chest that decreased in size on interval follow-up scan.  She also had a mass lesion noted in the colon - SCY was reassuring. She will still rarely smoke a cigarette. She has had some episodic dyspnea, about 3x a week. Uses albuterol most days but not all. Trelegy reliably. Has had some increased cough, has felt some URI sx over the last several days. Has used some mucinex. She uses her O2 prn, at night while sleeping.   CT chest 03/20/2022 reviewed by me shows some areas of centrilobular paraseptal emphysema and bronchial wall thickening.  There is a bandlike scar in the left lung base, lingula and medial right middle lobe, stable compared with priors.  ROV 02/25/2023 --72 year old woman follows up today for history of COPD and associated chronic hypoxemic respiratory failure.  Also with multiple scattered cavitary pulmonary nodules, decreased in size on serial CT chest.  She had an acute exacerbation at the beginning of the year, was hospitalized in May after a fall and noted to have extensive left lower lobe and right middle lobe atelectasis with mucous plugging.  She was treated for pneumonia at that time.  Has been maintained on Trelegy.  She was tried on azithromycin 3 times weekly earlier this year, did not really seem to change how she was feeling so discontinued.  Uses albuterol HFA rarely. Has DuoNeb but tries to minimize. No real cough.  Oxygen set at 3L/min Today she reports that she has fallen a few times, usually due to difficulty seeing, tripping. She believes her breathing has been doing ok, has recovered some since May. She is having dyspnea associated w anxiety and panic attacks.  These seem to be better.    Review of Systems As per HPI      Objective:   Physical Exam Vitals:   02/25/23 1428  BP: 118/76  Pulse: 95  Temp: (!) 97.3 F (36.3 C)  TempSrc: Temporal  SpO2: 98%  Weight: 88 lb (39.9 kg)  Height: 5\' 1"  (1.549 m)    Gen: Pleasant, very thin w some temporal wasting, in no distress,  normal affect  ENT: No lesions,  mouth clear, poor dentition, oropharynx clear, no postnasal drip  Neck: No JVD, no stridor  Lungs: No use of accessory muscles, bilateral inspiratory rhonchi, no wheeze.  She does wheeze on forced expiration  Cardiovascular: RRR, heart sounds normal, no murmur or gallops, no peripheral edema  Musculoskeletal: No deformities, no cyanosis or clubbing  Neuro: alert, awake, non focal  Skin: Warm, no lesions or rash      Assessment & Plan:   COPD (chronic obstructive pulmonary disease) (HCC) Please continue to use your Trelegy once daily.  Rinse and gargle after using. Keep albuterol available to use 2 puffs up to every 4 hours if needed for shortness of breath, chest tightness, wheezing.  Okay to use your DuoNeb as needed for shortness of breath. Follow with APP in 6 months, sooner if you have any problems Follow Dr. Delton Coombes in 1 year  Chronic hypoxemic respiratory failure (HCC) Continue your oxygen at 3 L/min     Levy Pupa, MD, PhD 02/25/2023, 2:47 PM [facility] [phone] or if no answer before 7:00PM call [phone] For any issues after 7:00PM please call eLink [phone]

## 2023-02-25 NOTE — Assessment & Plan Note (Signed)
Please continue to use your Trelegy once daily.  Rinse and gargle after using. Keep albuterol available to use 2 puffs up to every 4 hours if needed for shortness of breath, chest tightness, wheezing.  Okay to use your DuoNeb as needed for shortness of breath. Follow with APP in 6 months, sooner if you have any problems Follow Dr. Delton Coombes in 1 year

## 2023-03-08 DIAGNOSIS — M1712 Unilateral primary osteoarthritis, left knee: Secondary | ICD-10-CM | POA: Diagnosis not present

## 2023-03-08 DIAGNOSIS — S42292D Other displaced fracture of upper end of left humerus, subsequent encounter for fracture with routine healing: Secondary | ICD-10-CM | POA: Diagnosis not present

## 2023-03-08 DIAGNOSIS — M25862 Other specified joint disorders, left knee: Secondary | ICD-10-CM | POA: Diagnosis not present

## 2023-03-09 DIAGNOSIS — J432 Centrilobular emphysema: Secondary | ICD-10-CM | POA: Diagnosis not present

## 2023-03-09 DIAGNOSIS — Z515 Encounter for palliative care: Secondary | ICD-10-CM | POA: Diagnosis not present

## 2023-03-09 DIAGNOSIS — J449 Chronic obstructive pulmonary disease, unspecified: Secondary | ICD-10-CM | POA: Diagnosis not present

## 2023-03-09 DIAGNOSIS — J9611 Chronic respiratory failure with hypoxia: Secondary | ICD-10-CM | POA: Diagnosis not present

## 2023-03-10 ENCOUNTER — Telehealth: Payer: Self-pay | Admitting: Emergency Medicine

## 2023-03-10 ENCOUNTER — Other Ambulatory Visit: Payer: Self-pay

## 2023-03-10 MED ORDER — TRELEGY ELLIPTA 100-62.5-25 MCG/ACT IN AEPB: 1.0000 | INHALATION_SPRAY | Freq: Every day | RESPIRATORY_TRACT | 5 refills | Status: DC

## 2023-03-10 NOTE — Telephone Encounter (Signed)
Pt husband states we forgot to call in her Trelegy.  [facility] in [place]

## 2023-03-10 NOTE — Telephone Encounter (Signed)
Spoke with patients advised. I advised refills of Trelegy inhaler have been sent to pharmacy. NFN

## 2023-03-25 DIAGNOSIS — J449 Chronic obstructive pulmonary disease, unspecified: Secondary | ICD-10-CM | POA: Diagnosis not present

## 2023-04-05 DIAGNOSIS — S42292D Other displaced fracture of upper end of left humerus, subsequent encounter for fracture with routine healing: Secondary | ICD-10-CM | POA: Diagnosis not present

## 2023-04-05 DIAGNOSIS — M170 Bilateral primary osteoarthritis of knee: Secondary | ICD-10-CM | POA: Diagnosis not present

## 2023-04-11 DIAGNOSIS — R296 Repeated falls: Secondary | ICD-10-CM | POA: Diagnosis not present

## 2023-04-11 DIAGNOSIS — S2242XD Multiple fractures of ribs, left side, subsequent encounter for fracture with routine healing: Secondary | ICD-10-CM | POA: Diagnosis not present

## 2023-04-11 DIAGNOSIS — J9611 Chronic respiratory failure with hypoxia: Secondary | ICD-10-CM | POA: Diagnosis not present

## 2023-04-11 DIAGNOSIS — S42352D Displaced comminuted fracture of shaft of humerus, left arm, subsequent encounter for fracture with routine healing: Secondary | ICD-10-CM | POA: Diagnosis not present

## 2023-04-11 DIAGNOSIS — J449 Chronic obstructive pulmonary disease, unspecified: Secondary | ICD-10-CM | POA: Diagnosis not present

## 2023-04-15 DIAGNOSIS — I10 Essential (primary) hypertension: Secondary | ICD-10-CM | POA: Diagnosis not present

## 2023-04-15 DIAGNOSIS — J440 Chronic obstructive pulmonary disease with acute lower respiratory infection: Secondary | ICD-10-CM | POA: Diagnosis not present

## 2023-04-20 DIAGNOSIS — M84322A Stress fracture, left humerus, initial encounter for fracture: Secondary | ICD-10-CM | POA: Diagnosis not present

## 2023-04-24 DIAGNOSIS — J449 Chronic obstructive pulmonary disease, unspecified: Secondary | ICD-10-CM | POA: Diagnosis not present

## 2023-05-10 DIAGNOSIS — M25562 Pain in left knee: Secondary | ICD-10-CM | POA: Diagnosis not present

## 2023-05-10 DIAGNOSIS — S42292D Other displaced fracture of upper end of left humerus, subsequent encounter for fracture with routine healing: Secondary | ICD-10-CM | POA: Diagnosis not present

## 2023-05-11 DIAGNOSIS — S42352D Displaced comminuted fracture of shaft of humerus, left arm, subsequent encounter for fracture with routine healing: Secondary | ICD-10-CM | POA: Diagnosis not present

## 2023-05-11 DIAGNOSIS — J9611 Chronic respiratory failure with hypoxia: Secondary | ICD-10-CM | POA: Diagnosis not present

## 2023-05-11 DIAGNOSIS — R296 Repeated falls: Secondary | ICD-10-CM | POA: Diagnosis not present

## 2023-05-11 DIAGNOSIS — S2242XD Multiple fractures of ribs, left side, subsequent encounter for fracture with routine healing: Secondary | ICD-10-CM | POA: Diagnosis not present

## 2023-05-11 DIAGNOSIS — J449 Chronic obstructive pulmonary disease, unspecified: Secondary | ICD-10-CM | POA: Diagnosis not present

## 2023-05-25 DIAGNOSIS — J449 Chronic obstructive pulmonary disease, unspecified: Secondary | ICD-10-CM | POA: Diagnosis not present

## 2023-06-11 DIAGNOSIS — R296 Repeated falls: Secondary | ICD-10-CM | POA: Diagnosis not present

## 2023-06-11 DIAGNOSIS — J449 Chronic obstructive pulmonary disease, unspecified: Secondary | ICD-10-CM | POA: Diagnosis not present

## 2023-06-11 DIAGNOSIS — S2242XD Multiple fractures of ribs, left side, subsequent encounter for fracture with routine healing: Secondary | ICD-10-CM | POA: Diagnosis not present

## 2023-06-11 DIAGNOSIS — S42352D Displaced comminuted fracture of shaft of humerus, left arm, subsequent encounter for fracture with routine healing: Secondary | ICD-10-CM | POA: Diagnosis not present

## 2023-06-11 DIAGNOSIS — J9611 Chronic respiratory failure with hypoxia: Secondary | ICD-10-CM | POA: Diagnosis not present

## 2023-06-24 DIAGNOSIS — J449 Chronic obstructive pulmonary disease, unspecified: Secondary | ICD-10-CM | POA: Diagnosis not present

## 2023-07-01 DIAGNOSIS — F17210 Nicotine dependence, cigarettes, uncomplicated: Secondary | ICD-10-CM | POA: Diagnosis not present

## 2023-07-01 DIAGNOSIS — Z79899 Other long term (current) drug therapy: Secondary | ICD-10-CM | POA: Diagnosis not present

## 2023-07-01 DIAGNOSIS — E7800 Pure hypercholesterolemia, unspecified: Secondary | ICD-10-CM | POA: Diagnosis not present

## 2023-07-01 DIAGNOSIS — Z0000 Encounter for general adult medical examination without abnormal findings: Secondary | ICD-10-CM | POA: Diagnosis not present

## 2023-07-01 DIAGNOSIS — I10 Essential (primary) hypertension: Secondary | ICD-10-CM | POA: Diagnosis not present

## 2023-07-01 DIAGNOSIS — Z299 Encounter for prophylactic measures, unspecified: Secondary | ICD-10-CM | POA: Diagnosis not present

## 2023-07-01 DIAGNOSIS — R5383 Other fatigue: Secondary | ICD-10-CM | POA: Diagnosis not present

## 2023-07-01 DIAGNOSIS — E559 Vitamin D deficiency, unspecified: Secondary | ICD-10-CM | POA: Diagnosis not present

## 2023-07-11 DIAGNOSIS — J9611 Chronic respiratory failure with hypoxia: Secondary | ICD-10-CM | POA: Diagnosis not present

## 2023-07-11 DIAGNOSIS — S2242XD Multiple fractures of ribs, left side, subsequent encounter for fracture with routine healing: Secondary | ICD-10-CM | POA: Diagnosis not present

## 2023-07-11 DIAGNOSIS — R296 Repeated falls: Secondary | ICD-10-CM | POA: Diagnosis not present

## 2023-07-11 DIAGNOSIS — S42352D Displaced comminuted fracture of shaft of humerus, left arm, subsequent encounter for fracture with routine healing: Secondary | ICD-10-CM | POA: Diagnosis not present

## 2023-07-11 DIAGNOSIS — J449 Chronic obstructive pulmonary disease, unspecified: Secondary | ICD-10-CM | POA: Diagnosis not present

## 2023-07-16 DIAGNOSIS — I10 Essential (primary) hypertension: Secondary | ICD-10-CM | POA: Diagnosis not present

## 2023-07-16 DIAGNOSIS — G319 Degenerative disease of nervous system, unspecified: Secondary | ICD-10-CM | POA: Diagnosis not present

## 2023-07-16 DIAGNOSIS — I700 Atherosclerosis of aorta: Secondary | ICD-10-CM | POA: Diagnosis not present

## 2023-07-16 DIAGNOSIS — J9611 Chronic respiratory failure with hypoxia: Secondary | ICD-10-CM | POA: Diagnosis not present

## 2023-07-16 DIAGNOSIS — Z299 Encounter for prophylactic measures, unspecified: Secondary | ICD-10-CM | POA: Diagnosis not present

## 2023-07-16 DIAGNOSIS — F17210 Nicotine dependence, cigarettes, uncomplicated: Secondary | ICD-10-CM | POA: Diagnosis not present

## 2023-07-25 DIAGNOSIS — J449 Chronic obstructive pulmonary disease, unspecified: Secondary | ICD-10-CM | POA: Diagnosis not present

## 2023-07-30 DIAGNOSIS — J209 Acute bronchitis, unspecified: Secondary | ICD-10-CM | POA: Diagnosis not present

## 2023-07-30 DIAGNOSIS — I700 Atherosclerosis of aorta: Secondary | ICD-10-CM | POA: Diagnosis not present

## 2023-07-30 DIAGNOSIS — J441 Chronic obstructive pulmonary disease with (acute) exacerbation: Secondary | ICD-10-CM | POA: Diagnosis not present

## 2023-08-11 DIAGNOSIS — J449 Chronic obstructive pulmonary disease, unspecified: Secondary | ICD-10-CM | POA: Diagnosis not present

## 2023-08-11 DIAGNOSIS — S42352D Displaced comminuted fracture of shaft of humerus, left arm, subsequent encounter for fracture with routine healing: Secondary | ICD-10-CM | POA: Diagnosis not present

## 2023-08-11 DIAGNOSIS — S2242XD Multiple fractures of ribs, left side, subsequent encounter for fracture with routine healing: Secondary | ICD-10-CM | POA: Diagnosis not present

## 2023-08-11 DIAGNOSIS — R296 Repeated falls: Secondary | ICD-10-CM | POA: Diagnosis not present

## 2023-08-11 DIAGNOSIS — J9611 Chronic respiratory failure with hypoxia: Secondary | ICD-10-CM | POA: Diagnosis not present

## 2023-08-25 DIAGNOSIS — J449 Chronic obstructive pulmonary disease, unspecified: Secondary | ICD-10-CM | POA: Diagnosis not present

## 2023-08-26 ENCOUNTER — Ambulatory Visit: Payer: Medicare Other | Admitting: Nurse Practitioner

## 2023-09-10 DIAGNOSIS — R296 Repeated falls: Secondary | ICD-10-CM | POA: Diagnosis not present

## 2023-09-10 DIAGNOSIS — J9611 Chronic respiratory failure with hypoxia: Secondary | ICD-10-CM | POA: Diagnosis not present

## 2023-09-10 DIAGNOSIS — J449 Chronic obstructive pulmonary disease, unspecified: Secondary | ICD-10-CM | POA: Diagnosis not present

## 2023-09-10 DIAGNOSIS — S2242XD Multiple fractures of ribs, left side, subsequent encounter for fracture with routine healing: Secondary | ICD-10-CM | POA: Diagnosis not present

## 2023-09-10 DIAGNOSIS — S42352D Displaced comminuted fracture of shaft of humerus, left arm, subsequent encounter for fracture with routine healing: Secondary | ICD-10-CM | POA: Diagnosis not present

## 2023-09-22 DIAGNOSIS — J449 Chronic obstructive pulmonary disease, unspecified: Secondary | ICD-10-CM | POA: Diagnosis not present

## 2023-09-27 ENCOUNTER — Emergency Department (HOSPITAL_COMMUNITY)

## 2023-09-27 ENCOUNTER — Other Ambulatory Visit: Payer: Self-pay

## 2023-09-27 ENCOUNTER — Encounter (HOSPITAL_COMMUNITY): Payer: Self-pay | Admitting: Emergency Medicine

## 2023-09-27 ENCOUNTER — Inpatient Hospital Stay (HOSPITAL_COMMUNITY)
Admission: EM | Admit: 2023-09-27 | Discharge: 2023-10-02 | DRG: 481 | Disposition: A | Discharge status: Skilled Nursing Facility | Attending: Internal Medicine | Admitting: Internal Medicine

## 2023-09-27 DIAGNOSIS — K6389 Other specified diseases of intestine: Secondary | ICD-10-CM | POA: Diagnosis not present

## 2023-09-27 DIAGNOSIS — Z886 Allergy status to analgesic agent status: Secondary | ICD-10-CM

## 2023-09-27 DIAGNOSIS — R4189 Other symptoms and signs involving cognitive functions and awareness: Secondary | ICD-10-CM | POA: Diagnosis present

## 2023-09-27 DIAGNOSIS — I5020 Unspecified systolic (congestive) heart failure: Secondary | ICD-10-CM | POA: Diagnosis not present

## 2023-09-27 DIAGNOSIS — I110 Hypertensive heart disease with heart failure: Secondary | ICD-10-CM | POA: Diagnosis present

## 2023-09-27 DIAGNOSIS — M6281 Muscle weakness (generalized): Secondary | ICD-10-CM | POA: Diagnosis not present

## 2023-09-27 DIAGNOSIS — K219 Gastro-esophageal reflux disease without esophagitis: Secondary | ICD-10-CM | POA: Diagnosis not present

## 2023-09-27 DIAGNOSIS — S72144A Nondisplaced intertrochanteric fracture of right femur, initial encounter for closed fracture: Secondary | ICD-10-CM | POA: Diagnosis not present

## 2023-09-27 DIAGNOSIS — G3184 Mild cognitive impairment, so stated: Secondary | ICD-10-CM | POA: Diagnosis not present

## 2023-09-27 DIAGNOSIS — R636 Underweight: Secondary | ICD-10-CM | POA: Diagnosis not present

## 2023-09-27 DIAGNOSIS — D7589 Other specified diseases of blood and blood-forming organs: Secondary | ICD-10-CM | POA: Diagnosis present

## 2023-09-27 DIAGNOSIS — D72829 Elevated white blood cell count, unspecified: Secondary | ICD-10-CM | POA: Diagnosis not present

## 2023-09-27 DIAGNOSIS — F1020 Alcohol dependence, uncomplicated: Secondary | ICD-10-CM | POA: Diagnosis present

## 2023-09-27 DIAGNOSIS — I4719 Other supraventricular tachycardia: Secondary | ICD-10-CM | POA: Diagnosis not present

## 2023-09-27 DIAGNOSIS — E440 Moderate protein-calorie malnutrition: Secondary | ICD-10-CM | POA: Diagnosis not present

## 2023-09-27 DIAGNOSIS — R262 Difficulty in walking, not elsewhere classified: Secondary | ICD-10-CM | POA: Diagnosis not present

## 2023-09-27 DIAGNOSIS — Z825 Family history of asthma and other chronic lower respiratory diseases: Secondary | ICD-10-CM | POA: Diagnosis not present

## 2023-09-27 DIAGNOSIS — J99 Respiratory disorders in diseases classified elsewhere: Secondary | ICD-10-CM | POA: Diagnosis not present

## 2023-09-27 DIAGNOSIS — J9611 Chronic respiratory failure with hypoxia: Secondary | ICD-10-CM | POA: Diagnosis not present

## 2023-09-27 DIAGNOSIS — Z885 Allergy status to narcotic agent status: Secondary | ICD-10-CM

## 2023-09-27 DIAGNOSIS — I471 Supraventricular tachycardia, unspecified: Secondary | ICD-10-CM | POA: Diagnosis not present

## 2023-09-27 DIAGNOSIS — S72001A Fracture of unspecified part of neck of right femur, initial encounter for closed fracture: Secondary | ICD-10-CM | POA: Diagnosis not present

## 2023-09-27 DIAGNOSIS — Z9981 Dependence on supplemental oxygen: Secondary | ICD-10-CM

## 2023-09-27 DIAGNOSIS — Z7401 Bed confinement status: Secondary | ICD-10-CM | POA: Diagnosis not present

## 2023-09-27 DIAGNOSIS — S72144K Nondisplaced intertrochanteric fracture of right femur, subsequent encounter for closed fracture with nonunion: Secondary | ICD-10-CM | POA: Diagnosis not present

## 2023-09-27 DIAGNOSIS — F32A Depression, unspecified: Secondary | ICD-10-CM | POA: Insufficient documentation

## 2023-09-27 DIAGNOSIS — Z8249 Family history of ischemic heart disease and other diseases of the circulatory system: Secondary | ICD-10-CM

## 2023-09-27 DIAGNOSIS — R69 Illness, unspecified: Secondary | ICD-10-CM | POA: Diagnosis not present

## 2023-09-27 DIAGNOSIS — E782 Mixed hyperlipidemia: Secondary | ICD-10-CM | POA: Diagnosis not present

## 2023-09-27 DIAGNOSIS — Z79899 Other long term (current) drug therapy: Secondary | ICD-10-CM

## 2023-09-27 DIAGNOSIS — J439 Emphysema, unspecified: Secondary | ICD-10-CM | POA: Diagnosis not present

## 2023-09-27 DIAGNOSIS — Z87891 Personal history of nicotine dependence: Secondary | ICD-10-CM

## 2023-09-27 DIAGNOSIS — Z66 Do not resuscitate: Secondary | ICD-10-CM | POA: Diagnosis not present

## 2023-09-27 DIAGNOSIS — F419 Anxiety disorder, unspecified: Secondary | ICD-10-CM | POA: Diagnosis present

## 2023-09-27 DIAGNOSIS — D649 Anemia, unspecified: Secondary | ICD-10-CM | POA: Diagnosis not present

## 2023-09-27 DIAGNOSIS — Z9882 Breast implant status: Secondary | ICD-10-CM

## 2023-09-27 DIAGNOSIS — R64 Cachexia: Secondary | ICD-10-CM | POA: Diagnosis present

## 2023-09-27 DIAGNOSIS — C189 Malignant neoplasm of colon, unspecified: Secondary | ICD-10-CM | POA: Diagnosis not present

## 2023-09-27 DIAGNOSIS — Z743 Need for continuous supervision: Secondary | ICD-10-CM | POA: Diagnosis not present

## 2023-09-27 DIAGNOSIS — Z882 Allergy status to sulfonamides status: Secondary | ICD-10-CM

## 2023-09-27 DIAGNOSIS — I509 Heart failure, unspecified: Secondary | ICD-10-CM | POA: Diagnosis not present

## 2023-09-27 DIAGNOSIS — Z681 Body mass index (BMI) 19 or less, adult: Secondary | ICD-10-CM | POA: Diagnosis not present

## 2023-09-27 DIAGNOSIS — S72144S Nondisplaced intertrochanteric fracture of right femur, sequela: Secondary | ICD-10-CM | POA: Diagnosis not present

## 2023-09-27 DIAGNOSIS — R54 Age-related physical debility: Secondary | ICD-10-CM | POA: Diagnosis not present

## 2023-09-27 DIAGNOSIS — Z809 Family history of malignant neoplasm, unspecified: Secondary | ICD-10-CM

## 2023-09-27 DIAGNOSIS — I10 Essential (primary) hypertension: Secondary | ICD-10-CM | POA: Diagnosis not present

## 2023-09-27 DIAGNOSIS — W010XXA Fall on same level from slipping, tripping and stumbling without subsequent striking against object, initial encounter: Secondary | ICD-10-CM | POA: Diagnosis present

## 2023-09-27 DIAGNOSIS — S72144P Nondisplaced intertrochanteric fracture of right femur, subsequent encounter for closed fracture with malunion: Secondary | ICD-10-CM | POA: Diagnosis not present

## 2023-09-27 DIAGNOSIS — E785 Hyperlipidemia, unspecified: Secondary | ICD-10-CM | POA: Insufficient documentation

## 2023-09-27 DIAGNOSIS — M25551 Pain in right hip: Secondary | ICD-10-CM | POA: Diagnosis not present

## 2023-09-27 DIAGNOSIS — I700 Atherosclerosis of aorta: Secondary | ICD-10-CM | POA: Diagnosis not present

## 2023-09-27 DIAGNOSIS — Z90710 Acquired absence of both cervix and uterus: Secondary | ICD-10-CM

## 2023-09-27 DIAGNOSIS — R296 Repeated falls: Secondary | ICD-10-CM | POA: Diagnosis present

## 2023-09-27 DIAGNOSIS — Z888 Allergy status to other drugs, medicaments and biological substances status: Secondary | ICD-10-CM

## 2023-09-27 DIAGNOSIS — T8543XD Leakage of breast prosthesis and implant, subsequent encounter: Secondary | ICD-10-CM | POA: Diagnosis not present

## 2023-09-27 DIAGNOSIS — S72144D Nondisplaced intertrochanteric fracture of right femur, subsequent encounter for closed fracture with routine healing: Secondary | ICD-10-CM | POA: Diagnosis not present

## 2023-09-27 DIAGNOSIS — W19XXXA Unspecified fall, initial encounter: Secondary | ICD-10-CM | POA: Diagnosis not present

## 2023-09-27 DIAGNOSIS — Z7951 Long term (current) use of inhaled steroids: Secondary | ICD-10-CM

## 2023-09-27 DIAGNOSIS — D62 Acute posthemorrhagic anemia: Secondary | ICD-10-CM | POA: Diagnosis not present

## 2023-09-27 DIAGNOSIS — R278 Other lack of coordination: Secondary | ICD-10-CM | POA: Diagnosis not present

## 2023-09-27 DIAGNOSIS — M25572 Pain in left ankle and joints of left foot: Secondary | ICD-10-CM | POA: Diagnosis not present

## 2023-09-27 DIAGNOSIS — S72141A Displaced intertrochanteric fracture of right femur, initial encounter for closed fracture: Secondary | ICD-10-CM | POA: Diagnosis not present

## 2023-09-27 DIAGNOSIS — Z85038 Personal history of other malignant neoplasm of large intestine: Secondary | ICD-10-CM

## 2023-09-27 DIAGNOSIS — J42 Unspecified chronic bronchitis: Secondary | ICD-10-CM | POA: Diagnosis not present

## 2023-09-27 DIAGNOSIS — M1611 Unilateral primary osteoarthritis, right hip: Secondary | ICD-10-CM | POA: Diagnosis not present

## 2023-09-27 DIAGNOSIS — J449 Chronic obstructive pulmonary disease, unspecified: Secondary | ICD-10-CM | POA: Diagnosis not present

## 2023-09-27 LAB — CBC WITH DIFFERENTIAL/PLATELET
Abs Immature Granulocytes: 0.03 10*3/uL (ref 0.00–0.07)
Basophils Absolute: 0.1 10*3/uL (ref 0.0–0.1)
Basophils Relative: 1 %
Eosinophils Absolute: 0 10*3/uL (ref 0.0–0.5)
Eosinophils Relative: 0 %
HCT: 37.9 % (ref 36.0–46.0)
Hemoglobin: 12.4 g/dL (ref 12.0–15.0)
Immature Granulocytes: 0 %
Lymphocytes Relative: 14 %
Lymphs Abs: 1.7 10*3/uL (ref 0.7–4.0)
MCH: 35.5 pg — ABNORMAL HIGH (ref 26.0–34.0)
MCHC: 32.7 g/dL (ref 30.0–36.0)
MCV: 108.6 fL — ABNORMAL HIGH (ref 80.0–100.0)
Monocytes Absolute: 0.7 10*3/uL (ref 0.1–1.0)
Monocytes Relative: 6 %
Neutro Abs: 9.7 10*3/uL — ABNORMAL HIGH (ref 1.7–7.7)
Neutrophils Relative %: 79 %
Platelets: 211 10*3/uL (ref 150–400)
RBC: 3.49 MIL/uL — ABNORMAL LOW (ref 3.87–5.11)
RDW: 12 % (ref 11.5–15.5)
WBC: 12.2 10*3/uL — ABNORMAL HIGH (ref 4.0–10.5)
nRBC: 0 % (ref 0.0–0.2)

## 2023-09-27 LAB — BASIC METABOLIC PANEL
Anion gap: 15 (ref 5–15)
BUN: 22 mg/dL (ref 8–23)
CO2: 28 mmol/L (ref 22–32)
Calcium: 10.1 mg/dL (ref 8.9–10.3)
Chloride: 95 mmol/L — ABNORMAL LOW (ref 98–111)
Creatinine, Ser: 0.96 mg/dL (ref 0.44–1.00)
GFR, Estimated: >60 mL/min (ref >60)
Glucose, Bld: 107 mg/dL — ABNORMAL HIGH (ref 70–99)
Potassium: 4.7 mmol/L (ref 3.5–5.1)
Sodium: 138 mmol/L (ref 135–145)

## 2023-09-27 LAB — TYPE AND SCREEN
ABO/RH(D): A POS
Antibody Screen: NEGATIVE

## 2023-09-27 LAB — PROTIME-INR
INR: 0.9 (ref 0.8–1.2)
Prothrombin Time: 12.3 s (ref 11.4–15.2)

## 2023-09-27 MED ORDER — ONDANSETRON HCL 4 MG/2ML IJ SOLN
4.0000 mg | INTRAMUSCULAR | Status: DC | PRN
  Administered 2023-09-28: 4 mg via INTRAVENOUS

## 2023-09-27 MED ORDER — SODIUM CHLORIDE 0.9 % IV SOLN: INTRAVENOUS | Status: AC

## 2023-09-27 MED ORDER — OXYCODONE HCL 5 MG PO TABS: 5.0000 mg | ORAL_TABLET | ORAL | Status: DC | PRN

## 2023-09-27 MED ORDER — ACETAMINOPHEN 325 MG PO TABS: 650.0000 mg | ORAL_TABLET | ORAL | Status: DC | PRN

## 2023-09-27 MED ORDER — MORPHINE SULFATE (PF) 2 MG/ML IV SOLN
0.5000 mg | INTRAVENOUS | Status: DC | PRN
  Administered 2023-09-27 – 2023-09-28 (×3): 0.5 mg via INTRAVENOUS
  Filled 2023-09-27 (×3): qty 1

## 2023-09-27 MED ORDER — FENTANYL CITRATE PF 50 MCG/ML IJ SOSY
50.0000 ug | PREFILLED_SYRINGE | Freq: Once | INTRAMUSCULAR | Status: AC
  Administered 2023-09-27: 50 ug via INTRAVENOUS
  Filled 2023-09-27: qty 1

## 2023-09-27 MED ORDER — MAGNESIUM HYDROXIDE 400 MG/5ML PO SUSP: 30.0000 mL | Freq: Every day | ORAL | Status: DC | PRN

## 2023-09-27 NOTE — ED Notes (Signed)
 Report given to floor RN

## 2023-09-27 NOTE — ED Provider Notes (Signed)
 [place] EMERGENCY DEPARTMENT AT [facility] Provider Note   CSN: [id number] Arrival date & time: 09/27/23  6073     History  Chief Complaint  Patient presents with   Maria Stevens    Maria Stevens is a 73 y.o. female.   Fall   The patient is a 73 year old female, she endorses that she has frequent falls, she is on lisinopril for blood pressure rosuvastatin for cholesterol and takes breathing treatments for her advanced COPD.  She reports that she was walking in her house when she had a fall landing on her back and right hip, she did strike her head, she did not lose consciousness and does not have a headache, she does not have neck pain but she does have right hip pain and could not walk, paramedics were called for transport.    Home Medications Prior to Admission medications   Medication Sig Start Date End Date Taking? Authorizing Provider  albuterol (PROVENTIL) (5 MG/ML) 0.5% nebulizer solution Take 2.5 mg by nebulization every 6 (six) hours as needed for wheezing or shortness of breath.    [provider]  calcium carbonate (OSCAL) 1500 (600 Ca) MG TABS tablet Take by mouth 2 (two) times daily with a meal.    [provider]  calcium citrate-vitamin D (CITRACAL+D) 315-200 MG-UNIT tablet Take 1 tablet by mouth 2 (two) times daily.    [provider]  ergocalciferol (VITAMIN D2) 1.25 MG (50000 UT) capsule Take 50,000 Units by mouth once a week.    [provider]  famotidine (PEPCID) 40 MG tablet Take 40 mg by mouth daily. 12/15/22   [provider]  FLUoxetine (PROZAC) 40 MG capsule Take 40 mg by mouth daily.    [provider]  Fluticasone-Umeclidin-Vilant (TRELEGY ELLIPTA) 100-62.5-25 MCG/ACT AEPB Inhale 1 puff into the lungs daily. 03/10/23   Leslye Peer, MD  Fluticasone-Umeclidin-Vilant 100-62.5-25 MCG/INH AEPB Inhale 1 puff into the lungs daily. 03/19/21   Leslye Peer, MD  ipratropium-albuterol (DUONEB) 0.5-2.5  (3) MG/3ML SOLN Take 3 mLs by nebulization every 6 (six) hours as needed.    [provider]  lisinopril (PRINIVIL,ZESTRIL) 10 MG tablet Take 10 mg by mouth daily. Patient not taking: Reported on 02/25/2023    [provider]  rosuvastatin (CRESTOR) 10 MG tablet Take 10 mg by mouth daily.    [provider]  traMADol (ULTRAM) 50 MG tablet Take 50 mg by mouth 3 (three) times daily as needed. 02/01/23   [provider]      Allergies    Aspirin, Carisoprodol, Fluticasone-umeclidin-vilant, Homatropine, Hydrocodone, Sulfacetamide, Sulfamethoxazole, and Trimethoprim    Review of Systems   Review of Systems  All other systems reviewed and are negative.   Physical Exam Updated Vital Signs BP (!) 155/84 (BP Location: Right Arm)   Pulse 72   Temp 97.7 F (36.5 C) (Oral)   Resp 18   Ht 1.549 m (5\' 1" )   Wt 40 kg   SpO2 96%   BMI 16.66 kg/m  Physical Exam Vitals and nursing note reviewed.  Constitutional:      General: She is not in acute distress.    Appearance: She is well-developed.  HENT:     Head: Normocephalic and atraumatic.     Mouth/Throat:     Mouth: Mucous membranes are moist.     Pharynx: No oropharyngeal exudate.  Eyes:     General: No scleral icterus.       Right eye: No  discharge.        Left eye: No discharge.     Conjunctiva/sclera: Conjunctivae normal.     Pupils: Pupils are equal, round, and reactive to light.  Neck:     Thyroid: No thyromegaly.     Vascular: No JVD.  Cardiovascular:     Rate and Rhythm: Normal rate and regular rhythm.     Heart sounds: Normal heart sounds. No murmur heard.    No friction rub. No gallop.  Pulmonary:     Effort: Pulmonary effort is normal. No respiratory distress.     Breath sounds: Wheezing present. No rales.  Abdominal:     General: Bowel sounds are normal. There is no distension.     Palpations: Abdomen is soft. There is no mass.     Tenderness: There is no abdominal tenderness.   Musculoskeletal:        General: Tenderness and deformity present. Normal range of motion.     Cervical back: Normal range of motion and neck supple.     Right lower leg: No edema.     Left lower leg: No edema.     Comments: Lower extremity with slight internal rotation and shortening of the leg  Lymphadenopathy:     Cervical: No cervical adenopathy.  Skin:    General: Skin is warm and dry.     Findings: No erythema or rash.  Neurological:     Mental Status: She is alert.     Coordination: Coordination normal.  Psychiatric:        Behavior: Behavior normal.     ED Results / Procedures / Treatments   Labs (all labs ordered are listed, but only abnormal results are displayed) Labs Reviewed  BASIC METABOLIC PANEL - Abnormal; Notable for the following components:      Result Value   Chloride 95 (*)    Glucose, Bld 107 (*)    All other components within normal limits  CBC WITH DIFFERENTIAL/PLATELET - Abnormal; Notable for the following components:   WBC 12.2 (*)    RBC 3.49 (*)    MCV 108.6 (*)    MCH 35.5 (*)    Neutro Abs 9.7 (*)    All other components within normal limits  PROTIME-INR  TYPE AND SCREEN    EKG None  Radiology DG Hip Unilat With Pelvis 2-3 Views Right Result Date: 09/27/2023 CLINICAL DATA:  Right hip pain after fall EXAM: DG HIP (WITH OR WITHOUT PELVIS) 2-3V RIGHT COMPARISON:  None Available. FINDINGS: Acute nondisplaced right mildly impacted right femoral neck fracture. The femoral head remains seated in the acetabulum. IMPRESSION: Acute nondisplaced right femoral neck fracture. Electronically Signed   By: Minerva Fester M.D.   On: 09/27/2023 20:59   DG Chest 1 View Result Date: 09/27/2023 CLINICAL DATA:  Right hip pain after falling EXAM: CHEST  1 VIEW COMPARISON:  08/03/2022 FINDINGS: Stable cardiomediastinal silhouette. Aortic atherosclerotic calcification. Hyperinflation and chronic bronchitic changes. No focal consolidation, pleural effusion, or  pneumothorax. No displaced rib fractures. IMPRESSION: No active disease. Electronically Signed   By: Minerva Fester M.D.   On: 09/27/2023 20:55    Procedures Procedures    Medications Ordered in ED Medications  fentaNYL (SUBLIMAZE) injection 50 mcg (has no administration in time range)  fentaNYL (SUBLIMAZE) injection 50 mcg (50 mcg Intravenous Given 09/27/23 1938)    ED Course/ Medical Decision Making/ A&P  Medical Decision Making Amount and/or Complexity of Data Reviewed Labs: ordered. Radiology: ordered.  Risk Prescription drug management. Decision regarding hospitalization.    This patient presents to the ED for concern of possible hip injury after a fall, this involves an extensive number of treatment options, and is a complaint that carries with it a high risk of complications and morbidity.  The differential diagnosis includes she could be anemic, she could have taken too much of her medications, she might of had a stroke, I do not see any focal abnormalities on exam, I am concerned about a hip fracture that when she is holding her leg, she cannot straighten out her right leg without significant pain   Co morbidities that complicate the patient evaluation  The patient is very frail and almost cachectic, COPD, she is on her home oxygen   Additional history obtained:  Additional history obtained from the record External records from outside source obtained and reviewed including multiple visits in the outpatient setting following up for essential hypertension and COPD, no recent admissions to the hospital since May 2024, I do see an outside hospital visit on May 6 where she had reportedly had a closed fracture of her humeral shaft, at that time she was noted to have protein calorie malnutrition, nicotine dependence, COPD.  She no longer wears a splint or sling for her left arm where she had the fracture   Lab Tests:  I Ordered, and  personally interpreted labs.  The pertinent results include: INR 0.9, CBC shows macrocytic MCV, white blood cell count 12.2   Imaging Studies ordered:  I ordered imaging studies including x-ray of the chest is clear, x-ray of the hip shows a nondisplaced right femoral neck fracture I independently visualized and interpreted imaging which showed femoral neck fracture I agree with the radiologist interpretation   Cardiac Monitoring: / EKG:  The patient was maintained on a cardiac monitor.  I personally viewed and interpreted the cardiac monitored which showed an underlying rhythm of: Normal sinus rhythm   Consultations Obtained:  I requested consultation with the orthopedist Dr. Romeo Apple,  and discussed lab and imaging findings as well as pertinent plan - they recommend: Admission and likely surgery tomorrow he does request a CT scan which has been ordered Will discuss with hospitalist for admission   Problem List / ED Course / Critical interventions / Medication management  Patient appears to have a nondisplaced femoral neck fracture, this is consistent with her fall and the position of her leg I ordered medication including fentanyl  for pain Reevaluation of the patient after these medicines showed that the patient improved I have reviewed the patients home medicines and have made adjustments as needed   Social Determinants of Health:  Frequent falls   Test / Admission - Considered:  Admit to hospital         Final Clinical Impression(s) / ED Diagnoses Final diagnoses:  Closed fracture of neck of right femur, initial encounter [facility])    Rx / DC Orders ED Discharge Orders     None         Eber Hong, MD 09/27/23 2142

## 2023-09-27 NOTE — ED Notes (Signed)
 Patient transported to CT

## 2023-09-27 NOTE — ED Triage Notes (Signed)
 Pt c/o right hip pain after falling around 2pm today. Ems gave pt of fentanyl enroute.

## 2023-09-28 ENCOUNTER — Other Ambulatory Visit: Payer: Self-pay

## 2023-09-28 ENCOUNTER — Inpatient Hospital Stay (HOSPITAL_COMMUNITY): Admitting: Certified Registered"

## 2023-09-28 ENCOUNTER — Encounter (HOSPITAL_COMMUNITY)
Admission: EM | Disposition: A | Discharge status: Skilled Nursing Facility | Payer: Self-pay | Source: Home / Self Care | Attending: Internal Medicine

## 2023-09-28 ENCOUNTER — Encounter (HOSPITAL_COMMUNITY): Payer: Self-pay | Admitting: Family Medicine

## 2023-09-28 ENCOUNTER — Inpatient Hospital Stay (HOSPITAL_COMMUNITY)

## 2023-09-28 DIAGNOSIS — F419 Anxiety disorder, unspecified: Secondary | ICD-10-CM | POA: Insufficient documentation

## 2023-09-28 DIAGNOSIS — K219 Gastro-esophageal reflux disease without esophagitis: Secondary | ICD-10-CM | POA: Diagnosis not present

## 2023-09-28 DIAGNOSIS — I10 Essential (primary) hypertension: Secondary | ICD-10-CM

## 2023-09-28 DIAGNOSIS — S72001A Fracture of unspecified part of neck of right femur, initial encounter for closed fracture: Secondary | ICD-10-CM

## 2023-09-28 DIAGNOSIS — E785 Hyperlipidemia, unspecified: Secondary | ICD-10-CM | POA: Diagnosis not present

## 2023-09-28 DIAGNOSIS — Z87891 Personal history of nicotine dependence: Secondary | ICD-10-CM | POA: Diagnosis not present

## 2023-09-28 DIAGNOSIS — J449 Chronic obstructive pulmonary disease, unspecified: Secondary | ICD-10-CM | POA: Diagnosis not present

## 2023-09-28 DIAGNOSIS — S72144A Nondisplaced intertrochanteric fracture of right femur, initial encounter for closed fracture: Principal | ICD-10-CM

## 2023-09-28 HISTORY — PX: ORIF HIP FRACTURE: SHX2125

## 2023-09-28 LAB — BASIC METABOLIC PANEL
Anion gap: 9 (ref 5–15)
BUN: 20 mg/dL (ref 8–23)
CO2: 30 mmol/L (ref 22–32)
Calcium: 9.2 mg/dL (ref 8.9–10.3)
Chloride: 98 mmol/L (ref 98–111)
Creatinine, Ser: 0.84 mg/dL (ref 0.44–1.00)
GFR, Estimated: >60 mL/min (ref >60)
Glucose, Bld: 108 mg/dL — ABNORMAL HIGH (ref 70–99)
Potassium: 4.1 mmol/L (ref 3.5–5.1)
Sodium: 137 mmol/L (ref 135–145)

## 2023-09-28 LAB — CBC
HCT: 33.3 % — ABNORMAL LOW (ref 36.0–46.0)
Hemoglobin: 11.2 g/dL — ABNORMAL LOW (ref 12.0–15.0)
MCH: 36.5 pg — ABNORMAL HIGH (ref 26.0–34.0)
MCHC: 33.6 g/dL (ref 30.0–36.0)
MCV: 108.5 fL — ABNORMAL HIGH (ref 80.0–100.0)
Platelets: 195 10*3/uL (ref 150–400)
RBC: 3.07 MIL/uL — ABNORMAL LOW (ref 3.87–5.11)
RDW: 12.1 % (ref 11.5–15.5)
WBC: 9.5 10*3/uL (ref 4.0–10.5)
nRBC: 0 % (ref 0.0–0.2)

## 2023-09-28 LAB — SURGICAL PCR SCREEN
MRSA, PCR: NEGATIVE
Staphylococcus aureus: NEGATIVE

## 2023-09-28 SURGERY — OPEN REDUCTION INTERNAL FIXATION HIP
Anesthesia: Spinal | Site: Hip | Laterality: Right

## 2023-09-28 SURGERY — Surgical Case
Anesthesia: *Unknown

## 2023-09-28 MED ORDER — ENOXAPARIN SODIUM 30 MG/0.3ML IJ SOSY
30.0000 mg | PREFILLED_SYRINGE | INTRAMUSCULAR | Status: DC
  Administered 2023-09-29 – 2023-10-01 (×3): 30 mg via SUBCUTANEOUS
  Filled 2023-09-28 (×4): qty 0.3

## 2023-09-28 MED ORDER — DEXAMETHASONE SODIUM PHOSPHATE 10 MG/ML IJ SOLN
INTRAMUSCULAR | Status: DC | PRN
  Administered 2023-09-28: 10 mg via INTRAVENOUS

## 2023-09-28 MED ORDER — KETAMINE HCL 10 MG/ML IJ SOLN
INTRAMUSCULAR | Status: DC | PRN
  Administered 2023-09-28: 10 mg via INTRAVENOUS
  Administered 2023-09-28: 20 mg via INTRAVENOUS

## 2023-09-28 MED ORDER — PROPOFOL 500 MG/50ML IV EMUL
INTRAVENOUS | Status: AC
  Filled 2023-09-28: qty 50

## 2023-09-28 MED ORDER — DEXAMETHASONE SODIUM PHOSPHATE 10 MG/ML IJ SOLN
INTRAMUSCULAR | Status: AC
  Filled 2023-09-28: qty 1

## 2023-09-28 MED ORDER — CHLORHEXIDINE GLUCONATE 0.12 % MT SOLN
15.0000 mL | Freq: Once | OROMUCOSAL | Status: AC
  Administered 2023-09-28: 15 mL via OROMUCOSAL
  Filled 2023-09-28: qty 15

## 2023-09-28 MED ORDER — IPRATROPIUM-ALBUTEROL 0.5-2.5 (3) MG/3ML IN SOLN
3.0000 mL | Freq: Four times a day (QID) | RESPIRATORY_TRACT | Status: DC
  Administered 2023-09-28 (×3): 3 mL via RESPIRATORY_TRACT
  Filled 2023-09-28 (×4): qty 3

## 2023-09-28 MED ORDER — FENTANYL CITRATE (PF) 250 MCG/5ML IJ SOLN
INTRAMUSCULAR | Status: DC | PRN
  Administered 2023-09-28: 15 ug via INTRATHECAL

## 2023-09-28 MED ORDER — ACETAMINOPHEN 325 MG PO TABS
650.0000 mg | ORAL_TABLET | Freq: Four times a day (QID) | ORAL | Status: DC
  Administered 2023-09-28 – 2023-10-01 (×8): 650 mg via ORAL
  Filled 2023-09-28 (×11): qty 2

## 2023-09-28 MED ORDER — KETAMINE HCL 50 MG/5ML IJ SOSY
PREFILLED_SYRINGE | INTRAMUSCULAR | Status: AC
  Filled 2023-09-28: qty 5

## 2023-09-28 MED ORDER — VITAMIN D (ERGOCALCIFEROL) 1.25 MG (50000 UNIT) PO CAPS: 50000.0000 [IU] | ORAL_CAPSULE | ORAL | Status: DC

## 2023-09-28 MED ORDER — ENSURE ENLIVE PO LIQD
237.0000 mL | Freq: Three times a day (TID) | ORAL | Status: DC
  Administered 2023-09-30 – 2023-10-01 (×2): 237 mL via ORAL
  Filled 2023-09-28: qty 237

## 2023-09-28 MED ORDER — SODIUM CHLORIDE 0.9 % IR SOLN
Status: DC | PRN
  Administered 2023-09-28: 1000 mL

## 2023-09-28 MED ORDER — CEFAZOLIN SODIUM-DEXTROSE 2-4 GM/100ML-% IV SOLN
2.0000 g | INTRAVENOUS | Status: AC
  Administered 2023-09-28: 2 g via INTRAVENOUS
  Filled 2023-09-28: qty 100

## 2023-09-28 MED ORDER — POVIDONE-IODINE 10 % EX SWAB
2.0000 | Freq: Once | CUTANEOUS | Status: AC
  Administered 2023-09-28: 2 via TOPICAL

## 2023-09-28 MED ORDER — CHLORHEXIDINE GLUCONATE CLOTH 2 % EX PADS: 6.0000 | MEDICATED_PAD | Freq: Every day | CUTANEOUS | Status: DC

## 2023-09-28 MED ORDER — ONDANSETRON HCL 4 MG/2ML IJ SOLN
INTRAMUSCULAR | Status: AC
  Filled 2023-09-28: qty 2

## 2023-09-28 MED ORDER — IPRATROPIUM-ALBUTEROL 0.5-2.5 (3) MG/3ML IN SOLN
3.0000 mL | RESPIRATORY_TRACT | Status: DC | PRN
  Filled 2023-09-28: qty 3

## 2023-09-28 MED ORDER — PROPOFOL 500 MG/50ML IV EMUL
INTRAVENOUS | Status: AC
Start: 2023-09-28 — End: ?
  Filled 2023-09-28: qty 50

## 2023-09-28 MED ORDER — PROPOFOL 10 MG/ML IV BOLUS
INTRAVENOUS | Status: AC
  Filled 2023-09-28: qty 20

## 2023-09-28 MED ORDER — PHENYLEPHRINE HCL-NACL 20-0.9 MG/250ML-% IV SOLN
INTRAVENOUS | Status: AC
  Filled 2023-09-28: qty 250

## 2023-09-28 MED ORDER — CHLORHEXIDINE GLUCONATE 4 % EX SOLN: 60.0000 mL | Freq: Once | CUTANEOUS | Status: DC

## 2023-09-28 MED ORDER — FENTANYL CITRATE (PF) 100 MCG/2ML IJ SOLN
INTRAMUSCULAR | Status: AC
  Filled 2023-09-28: qty 2

## 2023-09-28 MED ORDER — METHOCARBAMOL 1000 MG/10ML IJ SOLN: 500.0000 mg | Freq: Four times a day (QID) | INTRAMUSCULAR | Status: DC | PRN

## 2023-09-28 MED ORDER — HYDROMORPHONE HCL 1 MG/ML IJ SOLN: 0.2500 mg | INTRAMUSCULAR | Status: DC | PRN

## 2023-09-28 MED ORDER — MIDAZOLAM HCL 2 MG/2ML IJ SOLN
INTRAMUSCULAR | Status: AC
Start: 2023-09-28 — End: ?
  Filled 2023-09-28: qty 2

## 2023-09-28 MED ORDER — METHOCARBAMOL 500 MG PO TABS
500.0000 mg | ORAL_TABLET | Freq: Four times a day (QID) | ORAL | Status: DC | PRN
  Administered 2023-09-29 – 2023-10-01 (×3): 500 mg via ORAL
  Filled 2023-09-28 (×5): qty 1

## 2023-09-28 MED ORDER — PHENYLEPHRINE 80 MCG/ML (10ML) SYRINGE FOR IV PUSH (FOR BLOOD PRESSURE SUPPORT)
PREFILLED_SYRINGE | INTRAVENOUS | Status: AC
  Filled 2023-09-28: qty 10

## 2023-09-28 MED ORDER — GLYCOPYRROLATE PF 0.2 MG/ML IJ SOSY
PREFILLED_SYRINGE | INTRAMUSCULAR | Status: DC | PRN
  Administered 2023-09-28: .2 mg via INTRAVENOUS

## 2023-09-28 MED ORDER — FAMOTIDINE 20 MG PO TABS
40.0000 mg | ORAL_TABLET | Freq: Every day | ORAL | Status: DC
  Administered 2023-09-29 – 2023-10-02 (×4): 40 mg via ORAL
  Filled 2023-09-28 (×4): qty 2

## 2023-09-28 MED ORDER — BUPIVACAINE-EPINEPHRINE (PF) 0.5% -1:200000 IJ SOLN
INTRAMUSCULAR | Status: AC
  Filled 2023-09-28: qty 30

## 2023-09-28 MED ORDER — TRANEXAMIC ACID-NACL 1000-0.7 MG/100ML-% IV SOLN
1000.0000 mg | INTRAVENOUS | Status: DC
  Filled 2023-09-28: qty 100

## 2023-09-28 MED ORDER — BUPIVACAINE IN DEXTROSE 0.75-8.25 % IT SOLN
INTRATHECAL | Status: DC | PRN
Start: 2023-09-28 — End: 2023-09-28
  Administered 2023-09-28: 1.6 mL via INTRATHECAL

## 2023-09-28 MED ORDER — MIDAZOLAM HCL 2 MG/2ML IJ SOLN
INTRAMUSCULAR | Status: DC | PRN
  Administered 2023-09-28: 2 mg via INTRAVENOUS

## 2023-09-28 MED ORDER — IPRATROPIUM-ALBUTEROL 0.5-2.5 (3) MG/3ML IN SOLN
3.0000 mL | Freq: Two times a day (BID) | RESPIRATORY_TRACT | Status: DC
  Administered 2023-09-29 – 2023-10-02 (×7): 3 mL via RESPIRATORY_TRACT
  Filled 2023-09-28 (×8): qty 3

## 2023-09-28 MED ORDER — CHLORHEXIDINE GLUCONATE 0.12 % MT SOLN
15.0000 mL | Freq: Four times a day (QID) | OROMUCOSAL | Status: AC
  Administered 2023-09-28 – 2023-09-30 (×8): 15 mL via OROMUCOSAL
  Filled 2023-09-28 (×8): qty 15

## 2023-09-28 MED ORDER — METOCLOPRAMIDE HCL 10 MG PO TABS: 5.0000 mg | ORAL_TABLET | Freq: Three times a day (TID) | ORAL | Status: DC | PRN

## 2023-09-28 MED ORDER — BUPIVACAINE IN DEXTROSE 0.75-8.25 % IT SOLN
INTRATHECAL | Status: AC
  Filled 2023-09-28: qty 2

## 2023-09-28 MED ORDER — BUPIVACAINE-EPINEPHRINE (PF) 0.5% -1:200000 IJ SOLN
INTRAMUSCULAR | Status: DC | PRN
  Administered 2023-09-28: 30 mL via PERINEURAL

## 2023-09-28 MED ORDER — PROPOFOL 10 MG/ML IV BOLUS
INTRAVENOUS | Status: DC | PRN
  Administered 2023-09-28: 50 mg via INTRAVENOUS

## 2023-09-28 MED ORDER — LACTATED RINGERS IV SOLN: INTRAVENOUS | Status: DC

## 2023-09-28 MED ORDER — GLYCOPYRROLATE PF 0.2 MG/ML IJ SOSY
PREFILLED_SYRINGE | INTRAMUSCULAR | Status: AC
  Filled 2023-09-28: qty 1

## 2023-09-28 MED ORDER — TRANEXAMIC ACID-NACL 1000-0.7 MG/100ML-% IV SOLN
1000.0000 mg | Freq: Once | INTRAVENOUS | Status: AC
  Administered 2023-09-28: 1000 mg via INTRAVENOUS
  Filled 2023-09-28: qty 100

## 2023-09-28 MED ORDER — PHENYLEPHRINE HCL-NACL 20-0.9 MG/250ML-% IV SOLN
INTRAVENOUS | Status: DC | PRN
  Administered 2023-09-28: 60 ug/min via INTRAVENOUS

## 2023-09-28 MED ORDER — PHENYLEPHRINE 80 MCG/ML (10ML) SYRINGE FOR IV PUSH (FOR BLOOD PRESSURE SUPPORT)
PREFILLED_SYRINGE | INTRAVENOUS | Status: DC | PRN
  Administered 2023-09-28 (×4): 160 ug via INTRAVENOUS

## 2023-09-28 MED ORDER — SODIUM CHLORIDE 0.9 % IV SOLN: 12.5000 mg | INTRAVENOUS | Status: DC | PRN

## 2023-09-28 MED ORDER — ONDANSETRON HCL 4 MG/2ML IJ SOLN: 4.0000 mg | Freq: Four times a day (QID) | INTRAMUSCULAR | Status: DC | PRN

## 2023-09-28 MED ORDER — CEFAZOLIN SODIUM-DEXTROSE 2-4 GM/100ML-% IV SOLN
2.0000 g | Freq: Four times a day (QID) | INTRAVENOUS | Status: AC
  Administered 2023-09-28 – 2023-09-29 (×2): 2 g via INTRAVENOUS
  Filled 2023-09-28 (×2): qty 100

## 2023-09-28 MED ORDER — DOCUSATE SODIUM 100 MG PO CAPS
100.0000 mg | ORAL_CAPSULE | Freq: Two times a day (BID) | ORAL | Status: DC
  Administered 2023-09-28 – 2023-10-02 (×8): 100 mg via ORAL
  Filled 2023-09-28 (×9): qty 1

## 2023-09-28 MED ORDER — FLUOXETINE HCL 20 MG PO CAPS
40.0000 mg | ORAL_CAPSULE | Freq: Every day | ORAL | Status: DC
  Administered 2023-09-29 – 2023-10-02 (×4): 40 mg via ORAL
  Filled 2023-09-28 (×4): qty 2

## 2023-09-28 MED ORDER — PHENOL 1.4 % MT LIQD
1.0000 | OROMUCOSAL | Status: DC | PRN
  Filled 2023-09-28: qty 177

## 2023-09-28 MED ORDER — ORAL CARE MOUTH RINSE: 15.0000 mL | Freq: Once | OROMUCOSAL | Status: AC

## 2023-09-28 MED ORDER — ROSUVASTATIN CALCIUM 20 MG PO TABS
20.0000 mg | ORAL_TABLET | ORAL | Status: DC
  Administered 2023-09-30: 20 mg via ORAL
  Filled 2023-09-28: qty 1

## 2023-09-28 MED ORDER — MENTHOL 3 MG MT LOZG
1.0000 | LOZENGE | OROMUCOSAL | Status: DC | PRN
  Filled 2023-09-28: qty 9

## 2023-09-28 MED ORDER — ALPRAZOLAM 1 MG PO TABS
1.0000 mg | ORAL_TABLET | Freq: Three times a day (TID) | ORAL | Status: DC
  Administered 2023-09-28 – 2023-10-02 (×11): 1 mg via ORAL
  Filled 2023-09-28 (×12): qty 1

## 2023-09-28 MED ORDER — ADULT MULTIVITAMIN W/MINERALS CH
1.0000 | ORAL_TABLET | Freq: Every day | ORAL | Status: DC
Start: 2023-09-29 — End: 2023-10-02
  Administered 2023-09-29 – 2023-10-02 (×4): 1 via ORAL
  Filled 2023-09-28 (×5): qty 1

## 2023-09-28 MED ORDER — METOCLOPRAMIDE HCL 5 MG/ML IJ SOLN: 5.0000 mg | Freq: Three times a day (TID) | INTRAMUSCULAR | Status: DC | PRN

## 2023-09-28 MED ORDER — PROPOFOL 500 MG/50ML IV EMUL
INTRAVENOUS | Status: DC | PRN
  Administered 2023-09-28: 75 ug/kg/min via INTRAVENOUS

## 2023-09-28 SURGICAL SUPPLY — 51 items
BENZOIN TINCTURE PRP APPL 2/3 (GAUZE/BANDAGES/DRESSINGS) IMPLANT
BIT DRILL TWIST 3.5MM (BIT) IMPLANT
BLADE SURG SZ10 CARB STEEL (BLADE) ×2 IMPLANT
BNDG GAUZE DERMACEA FLUFF 4 (GAUZE/BANDAGES/DRESSINGS) IMPLANT
CHLORAPREP W/TINT 26 (MISCELLANEOUS) ×1 IMPLANT
CLOTH BEACON ORANGE TIMEOUT ST (SAFETY) ×1 IMPLANT
COUNTER NDL MAGNETIC 40 RED (SET/KITS/TRAYS/PACK) ×1 IMPLANT
COUNTER NEEDLE MAGNETIC 40 RED (SET/KITS/TRAYS/PACK) ×1 IMPLANT
COVER LIGHT HANDLE STERIS (MISCELLANEOUS) ×4 IMPLANT
COVER MAYO STAND XLG (MISCELLANEOUS) ×1 IMPLANT
COVER PERINEAL POST (MISCELLANEOUS) ×1 IMPLANT
DRAPE STERI IOBAN 125X83 (DRAPES) ×1 IMPLANT
DRILL TWIST 3.5MM (BIT) ×1 IMPLANT
DRSG MEPILEX POST OP 4X12 (GAUZE/BANDAGES/DRESSINGS) IMPLANT
DRSG MEPILEX SACRM 8.7X9.8 (GAUZE/BANDAGES/DRESSINGS) ×1 IMPLANT
ELECT REM PT RETURN 9FT ADLT (ELECTROSURGICAL) ×1 IMPLANT
ELECTRODE REM PT RTRN 9FT ADLT (ELECTROSURGICAL) ×1 IMPLANT
GLOVE BIO SURGEON STRL SZ7 (GLOVE) IMPLANT
GLOVE BIOGEL PI IND STRL 7.0 (GLOVE) ×2 IMPLANT
GLOVE SKINSENSE STRL SZ8.0 LF (GLOVE) IMPLANT
GLOVE SS N UNI LF 8.5 STRL (GLOVE) ×1 IMPLANT
GOWN STRL REUS W/TWL LRG LVL3 (GOWN DISPOSABLE) ×2 IMPLANT
GOWN STRL REUS W/TWL XL LVL3 (GOWN DISPOSABLE) ×1 IMPLANT
GUIDE PIN 2.4X230 (Pin) IMPLANT
INST SET MAJOR BONE (KITS) ×1 IMPLANT
KIT TURNOVER CYSTO (KITS) ×1 IMPLANT
MANIFOLD NEPTUNE II (INSTRUMENTS) ×1 IMPLANT
MARKER SKIN DUAL TIP RULER LAB (MISCELLANEOUS) ×1 IMPLANT
NDL HYPO 21X1.5 SAFETY (NEEDLE) ×1 IMPLANT
NDL SPNL 18GX3.5 QUINCKE PK (NEEDLE) ×1 IMPLANT
NEEDLE HYPO 21X1.5 SAFETY (NEEDLE) ×1 IMPLANT
NEEDLE SPNL 18GX3.5 QUINCKE PK (NEEDLE) ×1 IMPLANT
NS IRRIG 1000ML POUR BTL (IV SOLUTION) ×1 IMPLANT
PACK SRG BSC III STRL LF ECLPS (CUSTOM PROCEDURE TRAY) ×1 IMPLANT
PAD ABD 5X9 TENDERSORB (GAUZE/BANDAGES/DRESSINGS) IMPLANT
PENCIL SMOKE EVACUATOR COATED (MISCELLANEOUS) ×1 IMPLANT
PLATE 130 DEGREE 2HOLE (Plate) IMPLANT
POSITIONER HEAD 8X9X4 ADT (SOFTGOODS) ×1 IMPLANT
SCREW COMPRESSION 19.0M (Screw) IMPLANT
SCREW CORTICAL SFTP 4.5X38MM (Screw) IMPLANT
SCREW LAG 80 (Screw) IMPLANT
SET BASIN LINEN APH (SET/KITS/TRAYS/PACK) ×1 IMPLANT
SPONGE T-LAP 18X18 ~~LOC~~+RFID (SPONGE) ×2 IMPLANT
STRIP CLOSURE SKIN 1/2X4 (GAUZE/BANDAGES/DRESSINGS) IMPLANT
SUT BRALON NAB BRD #1 30IN (SUTURE) IMPLANT
SUT MNCRL 0 VIOLET CTX 36 (SUTURE) IMPLANT
SUT MON AB 0 CT1 (SUTURE) ×1 IMPLANT
SYR 30ML LL (SYRINGE) ×1 IMPLANT
SYR BULB IRRIG 60ML STRL (SYRINGE) ×2 IMPLANT
WATER STERILE IRR 1000ML POUR (IV SOLUTION) IMPLANT
YANKAUER SUCT 12FT TUBE ARGYLE (SUCTIONS) ×1 IMPLANT

## 2023-09-28 NOTE — Evaluation (Signed)
 Speech Language Pathology Evaluation Patient Details Name: Maria Stevens MRN: [id number] DOB: Mar 17, 1951 Today's Date: 09/28/2023 Time: 5366-4403 SLP Time Calculation (min) (ACUTE ONLY): 27 min  Problem List:  Patient Active Problem List   Diagnosis Date Noted   Anxiety and depression 09/28/2023   Dyslipidemia 09/28/2023   GERD without esophagitis 09/28/2023   Closed right hip fracture (HCC) 09/27/2023   Colonic mass 06/12/2021   Abnormal CT of the chest 02/20/2021   COPD (chronic obstructive pulmonary disease) (HCC) 02/20/2021   Chronic hypoxemic respiratory failure (HCC) 02/20/2021   Ruptured right breast implant, initial encounter 10/18/2018   Rupture of implant of left breast 10/18/2018   Past Medical History:  Past Medical History:  Diagnosis Date   Colon cancer (HCC)    Reported by patient   Emphysema of lung (HCC)    Hypertension    Past Surgical History:  Past Surgical History:  Procedure Laterality Date   PLACEMENT OF BREAST IMPLANTS     reported by patient   HPI:  Maria Stevens is a 73 y.o. female with medical history significant for emphysema with chronic respiratory failure on home O2, CHF and hypertension, who presented to emergency room on 09/27/23 with acute onset of accidental mechanical fall while she was in her kitchen when she lost balance and fell backwards with subsequent right hip pain.  She denies any presyncope or syncope.  No chest pain or palpitations.  No paresthesias or focal muscle weakness.  No dysuria, oliguria or hematuria or flank pain.  She is on home O2 at all times. ST consulted for speech/language cognitive evaluation.  Assessment / Plan / Recommendation Clinical Impression  Pt assessed via informal observations, portions of [employer] Mental Status Examination (SLUMS) with an overall score not obtained d/t pain impacting sustained attention and prior level of functioning.  Pt/husband voiced loss of various family members in a  short period of time impacting cumulative mental status.  Short-term memory/decreased recall of new information and occasional anomia noted during conversation.  Naming tasks accurate and simple directives followed without difficulty. Reading/writing were not assessed d/t pt comment as glasses were not readily available during assessment.  Pt oriented to self, place and situation, but not time (month and day of week correct, but year incorrect).  Husband stated "she mixes her dreams/reality up sometimes."  Pt able to complete responsive naming and divergent naming tasks with min A, but did exhibit mild anomia during conversation.  OME unremarkable and speech was observed to be intelligible within conversation. Discussed need for ST if anomia and/or STM/memory retrieval continues to deteriorate prn.  Pt/husband in agreement.  No further ST recommended at this time.  Thank you for this consult.    SLP Assessment  SLP Recommendation/Assessment: Patient does not need any further Speech Language Pathology Services SLP Visit Diagnosis: Cognitive communication deficit (R41.841)    Recommendations for follow up therapy are one component of a multi-disciplinary discharge planning process, led by the attending physician.  Recommendations may be updated based on patient status, additional functional criteria and insurance authorization.    Follow Up Recommendations  No SLP follow up    Assistance Recommended at Discharge  Intermittent Supervision/Assistance  Functional Status Assessment Patient has had a recent decline in their functional status and demonstrates the ability to make significant improvements in function in a reasonable and predictable amount of time.  Frequency and Duration Other (Comment) (evaluation only)         SLP Evaluation Cognition  Overall Cognitive Status: Within Functional Limits for tasks assessed Arousal/Alertness: Awake/alert Orientation Level: Oriented to person;Oriented to  place;Oriented to situation;Disoriented to time Day of Week: Correct Attention: Sustained Sustained Attention: Appears intact Memory: Impaired Memory Impairment: Decreased recall of new information;Decreased short term memory Decreased Short Term Memory: Verbal basic;Functional basic Awareness: Appears intact Problem Solving: Appears intact Safety/Judgment: Appears intact       Comprehension  Auditory Comprehension Overall Auditory Comprehension: Appears within functional limits for tasks assessed Conversation: Complex Interfering Components: Pain Visual Recognition/Discrimination Discrimination: Not tested Reading Comprehension Reading Status: Not tested    Expression Expression Primary Mode of Expression: Verbal Verbal Expression Overall Verbal Expression: Impaired at baseline Level of Generative/Spontaneous Verbalization: Conversation Naming: Impairment Responsive: 76-100% accurate Confrontation: Within functional limits Convergent: 75-100% accurate Divergent: 75-100% accurate Verbal Errors: Aware of errors Pragmatics: No impairment Interfering Components: Other (comment) (pain; prior level of function) Non-Verbal Means of Communication: Not applicable Written Expression Written Expression: Not tested   Oral / Motor  Oral Motor/Sensory Function Overall Oral Motor/Sensory Function: Within functional limits Motor Speech Overall Motor Speech: Appears within functional limits for tasks assessed Respiration: Within functional limits Phonation: Normal Resonance: Within functional limits Articulation: Within functional limitis Intelligibility: Intelligible Motor Planning: Witnin functional limits Motor Speech Errors: Not applicable Interfering Components: Premorbid status            Pat Anjali Manzella,M.S.,CCC-SLP 09/28/2023, 10:18 AM

## 2023-09-28 NOTE — Hospital Course (Addendum)
 73 y.o. 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  She denies any presyncope or syncope.  No chest pain or palpitations.  No paresthesias or focal muscle weakness.  No dysuria, oliguria or hematuria or flank pain.  She is on home O2 at all times.  Pt was admitted with acute right hip fracture with plan to go to OR on 09/28/23 with Dr. Romeo Apple.  Patient underwent ORIF on 09/28/2023.  PT evaluation recommended skilled nursing facility with which the patient's family agreed.  The patient continued to be dependent on 3 L nasal cannula which is her home demand.  The patient was started on bronchodilators for her COPD.  She has continued to smoke at home.  The patient continued to drink alcohol although it was unclear when her last drink was.  There was no signs of alcohol withdrawal. The patient remained afebrile and hemodynamically stable.  She remained stable on her home 3L [place].  Her d/c was delayed on 10/01/23 to 3/22 due to lack of transport.

## 2023-09-28 NOTE — Plan of Care (Signed)
   Problem: Education: Goal: Knowledge of General Education information will improve Description Including pain rating scale, medication(s)/side effects and non-pharmacologic comfort measures Outcome: Progressing   Problem: Education: Goal: Knowledge of General Education information will improve Description Including pain rating scale, medication(s)/side effects and non-pharmacologic comfort measures Outcome: Progressing

## 2023-09-28 NOTE — Anesthesia Procedure Notes (Signed)
 Spinal  Patient location during procedure: OR Start time: 09/28/2023 1:00 PM End time: 09/28/2023 1:05 PM Reason for block: surgical anesthesia Staffing Performed: resident/CRNA  Resident/CRNA: Izola Price., CRNA Performed by: Izola Price., CRNA Authorized by: Izola Price., CRNA   Preanesthetic Checklist Completed: patient identified, IV checked, site marked, risks and benefits discussed, surgical consent, monitors and equipment checked, pre-op evaluation and timeout performed Spinal Block Patient position: sitting Prep: Betadine Patient monitoring: heart rate, cardiac monitor, continuous pulse ox and blood pressure Approach: right paramedian Location: L3-4 Injection technique: single-shot Needle Needle type: Sprotte and Pencan  Needle gauge: 24 G Needle length: 10 cm Assessment Sensory level: T6 Events: CSF return

## 2023-09-28 NOTE — Anesthesia Preprocedure Evaluation (Addendum)
 Anesthesia Evaluation  Patient identified by MRN, date of birth, ID band Patient awake    Reviewed: Allergy & Precautions, H&P , NPO status , Patient's Chart, lab work & pertinent test results, reviewed documented beta blocker date and time   Airway Mallampati: II  TM Distance: >3 FB Neck ROM: full    Dental  (+) Edentulous Upper, Missing, Dental Advisory Given   Pulmonary shortness of breath, with exertion and Long-Term Oxygen Therapy, COPD,  COPD inhaler and oxygen dependent, former smoker Chronic respiratory failure.  4 L/M  all the time   Pulmonary exam normal breath sounds clear to auscultation       Cardiovascular Exercise Tolerance: Good hypertension, Normal cardiovascular exam Rhythm:regular Rate:Normal     Neuro/Psych  PSYCHIATRIC DISORDERS Anxiety Depression    negative neurological ROS     GI/Hepatic negative GI ROS,GERD  ,,(+)     substance abuse  alcohol use  Endo/Other  negative endocrine ROS    Renal/GU negative Renal ROS  negative genitourinary   Musculoskeletal   Abdominal   Peds  Hematology negative hematology ROS (+) Coags are  good   Anesthesia Other Findings Colon cancer  Reproductive/Obstetrics negative OB ROS                             Anesthesia Physical Anesthesia Plan  ASA: 4  Anesthesia Plan: Spinal   Post-op Pain Management: Dilaudid IV   Induction:   PONV Risk Score and Plan:   Airway Management Planned: Nasal Cannula, Natural Airway and Simple Face Mask  Additional Equipment: None  Intra-op Plan:   Post-operative Plan:   Informed Consent: I have reviewed the patients History and Physical, chart, labs and discussed the procedure including the risks, benefits and alternatives for the proposed anesthesia with the patient or authorized representative who has indicated his/her understanding and acceptance.     Dental Advisory Given  Plan  Discussed with: CRNA  Anesthesia Plan Comments:        Anesthesia Quick Evaluation

## 2023-09-28 NOTE — H&P (Addendum)
 [place]   PATIENT NAME: Maria Stevens    MR#:  [id number]  DATE OF BIRTH:  Mar 07, 1951  DATE OF ADMISSION:  09/27/2023  PRIMARY CARE PHYSICIAN: Ignatius Specking, MD   Patient is coming from: Home  REQUESTING/REFERRING PHYSICIAN: Eber Hong, MD  CHIEF COMPLAINT:   Chief Complaint  Patient presents with   Fall    HISTORY OF PRESENT ILLNESS:  GWENLYN Stevens is a 73 y.o. 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  She denies any presyncope or syncope.  No chest pain or palpitations.  No paresthesias or focal muscle weakness.  No dysuria, oliguria or hematuria or flank pain.  She is on home O2 at all times.  ED Course: When she came to the ER, BP was 155/84 with otherwise normal vital signs.  Labs revealed a chloride of 95 with blood glucose of 107 with otherwise unremarkable BMP.  CBC showed leukocytosis 12.2 with neutrophilia and macrocytosis.  INR was 0.9 and PT 12.3.  Blood group with ANA with positive antibodies. EKG as reviewed by me : EKG showed normal sinus rhythm with rate of 71 with marked sinus arrhythmia. Imaging: Portable chest x-ray showed no acute cardiopulmonary disease.  The patient was given 50 mcg of IV fentanyl twice.  She will be admitted to a medical telemetry bed for further evaluation and management. PAST MEDICAL HISTORY:   Past Medical History:  Diagnosis Date   Colon cancer [facility])    Reported by patient   Emphysema of lung (HCC)    Hypertension   -CHF.  PAST SURGICAL HISTORY:   Past Surgical History:  Procedure Laterality Date   PLACEMENT OF BREAST IMPLANTS     reported by patient  -Appendectomy - Tonsillectomy  SOCIAL HISTORY:   Social History   Tobacco Use   Smoking status:  Former    Current packs/day: 0.00    Types: Cigarettes    Quit date: 01/30/2021    Years since quitting: 2.6   Smokeless tobacco: Never  Substance Use Topics   Alcohol use: Not Currently    Comment: Stopped drinking 3 weeks ago.    FAMILY HISTORY:   Family History  Problem Relation Age of Onset   Heart disease Mother    Emphysema Father    Allergies Sister    Cancer Brother     DRUG ALLERGIES:   Allergies  Allergen Reactions   Carisoprodol    Homatropine    Hydrocodone    Sulfacetamide    Sulfamethoxazole    Trimethoprim    Aspirin Nausea Only    REVIEW OF SYSTEMS:   ROS As per history of present illness. All pertinent systems were reviewed above. Constitutional, HEENT, cardiovascular, respiratory, GI, GU, musculoskeletal, neuro, psychiatric, endocrine, integumentary and hematologic systems were reviewed and are otherwise negative/unremarkable except for positive findings mentioned above in the HPI.   MEDICATIONS AT HOME:   Prior to Admission medications   Medication Sig Start Date End Date Taking? Authorizing Provider  ALPRAZolam Prudy Feeler) 1 MG tablet Take 1 mg by mouth 3 (three) times daily.   Yes [provider]  dexamethasone 0.5 MG/5ML elixir 5 mLs 3 (three) times daily. Sublingually for 3 minutes then spit out 09/21/23  Yes [provider]  ergocalciferol (VITAMIN D2) 1.25 MG (50000 UT) capsule Take 50,000 Units by mouth once a week.   Yes [provider]  famotidine (PEPCID) 40 MG tablet Take 40 mg by mouth daily. 12/15/22  Yes [provider]  FLUoxetine (PROZAC) 40 MG capsule Take 40 mg by mouth daily.   Yes [provider]  Fluticasone-Umeclidin-Vilant (TRELEGY ELLIPTA) 100-62.5-25 MCG/ACT AEPB Inhale 1 puff into the lungs daily. 03/10/23  Yes Leslye Peer, MD  rosuvastatin (CRESTOR) 20 MG tablet Take 20 mg by mouth 2 (two) times a week.   Yes [provider]      VITAL SIGNS:  Blood pressure 139/75,  pulse 83, temperature 98.2 F (36.8 C), resp. rate 18, height 5\' 1"  (1.549 m), weight 40 kg, SpO2 97%.  PHYSICAL EXAMINATION:  Physical Exam  GENERAL:  73 y.o.-year-old patient lying in the bed with no acute distress.  EYES: Pupils equal, round, reactive to light and accommodation. No scleral icterus. Extraocular muscles intact.  HEENT: Head atraumatic, normocephalic. Oropharynx and nasopharynx clear.  NECK:  Supple, no jugular venous distention. No thyroid enlargement, no tenderness.  LUNGS: Normal breath sounds bilaterally, no wheezing, rales,rhonchi or crepitation. No use of accessory muscles of respiration.  CARDIOVASCULAR: Regular rate and rhythm, S1, S2 normal. No murmurs, rubs, or gallops.  ABDOMEN: Soft, nondistended, nontender. Bowel sounds present. No organomegaly or mass.  EXTREMITIES: No pedal edema, cyanosis, or clubbing.  NEUROLOGIC: Cranial nerves II through XII are intact. Muscle strength 5/5 in all extremities. Sensation intact. Gait not checked. Musculoskeletal: Right lateral hip tenderness. PSYCHIATRIC: The patient is alert and oriented x 3.  Normal affect and good eye contact. SKIN: No obvious rash, lesion, or ulcer.   LABORATORY PANEL:   CBC Recent Labs  Lab 09/27/23 1924  WBC 12.2*  HGB 12.4  HCT 37.9  PLT 211   ------------------------------------------------------------------------------------------------------------------  Chemistries  Recent Labs  Lab 09/27/23 1924  NA 138  K 4.7  CL 95*  CO2 28  GLUCOSE 107*  BUN 22  CREATININE 0.96  CALCIUM 10.1   ------------------------------------------------------------------------------------------------------------------  Cardiac Enzymes No results for input(s): "TROPONINI" in the last 168 hours. ------------------------------------------------------------------------------------------------------------------  RADIOLOGY:  CT 3D RECON AT SCANNER Result Date: 09/27/2023 CLINICAL DATA:  Nonspecific  (abnormal) findings on radiological and other examination of musculoskeletal system right hip. EXAM: 3-DIMENSIONAL CT IMAGE RENDERING ON ACQUISITION WORKSTATION TECHNIQUE: 3-dimensional CT images were rendered by post-processing of the original CT data on an acquisition workstation. The 3-dimensional CT images were interpreted and findings were reported in the accompanying complete CT report for this study COMPARISON:  CT scan right hip same date. FINDINGS: Redemonstrated is a comminuted intertrochanteric right hip fracture including the lesser trochanter and fracture fragmentation extending along the greater trochanter. There is less than 1 cortex width of lateral displacement of the main distal fragment. The fracture is also slightly impacted but otherwise nondisplaced. The pelvic ring and right acetabulum are intact. Mild changes of right hip osteoarthritis are redemonstrated. Osteopenia. IMPRESSION: Comminuted intertrochanteric right hip fracture is redemonstrated as described above. Electronically Signed   By: Almira Bar M.D.   On: 09/27/2023 22:09   CT Hip Right Wo Contrast Result Date: 09/27/2023 CLINICAL DATA:  Right hip pain after fall, right femoral neck fracture on x-ray EXAM: CT OF THE RIGHT HIP WITHOUT CONTRAST TECHNIQUE: Multidetector CT imaging of the right hip was performed according to the standard protocol. Multiplanar CT image reconstructions were also generated. RADIATION DOSE REDUCTION: This exam was performed according to the  departmental dose-optimization program which includes automated exposure control, adjustment of the mA and/or kV according to patient size and/or use of iterative reconstruction technique. COMPARISON:  09/27/2023 FINDINGS: Bones/Joint/Cartilage There is a mildly comminuted inter trochanteric right hip fracture, with mild impaction and ventral angulation at the fracture site. The right femoral head articulates normally with the right acetabulum. There are no other  acute displaced fractures. Mild right hip osteoarthritis. Ligaments Suboptimally assessed by CT. Muscles and Tendons No gross abnormalities. Soft tissues The soft tissues are grossly unremarkable. Atherosclerosis of the iliac and femoral vessels. Reconstructed images demonstrate no additional findings. IMPRESSION: 1. Comminuted intertrochanteric right hip fracture, with mild impaction and ventral angulation at the fracture site. Electronically Signed   By: Sharlet Salina M.D.   On: 09/27/2023 21:55   DG Hip Unilat With Pelvis 2-3 Views Right Result Date: 09/27/2023 CLINICAL DATA:  Right hip pain after fall EXAM: DG HIP (WITH OR WITHOUT PELVIS) 2-3V RIGHT COMPARISON:  None Available. FINDINGS: Acute nondisplaced right mildly impacted right femoral neck fracture. The femoral head remains seated in the acetabulum. IMPRESSION: Acute nondisplaced right femoral neck fracture. Electronically Signed   By: Minerva Fester M.D.   On: 09/27/2023 20:59   DG Chest 1 View Result Date: 09/27/2023 CLINICAL DATA:  Right hip pain after falling EXAM: CHEST  1 VIEW COMPARISON:  08/03/2022 FINDINGS: Stable cardiomediastinal silhouette. Aortic atherosclerotic calcification. Hyperinflation and chronic bronchitic changes. No focal consolidation, pleural effusion, or pneumothorax. No displaced rib fractures. IMPRESSION: No active disease. Electronically Signed   By: Minerva Fester M.D.   On: 09/27/2023 20:55      IMPRESSION AND PLAN:  Assessment and Plan: * Closed right hip fracture [facility]) - The patient will be admitted to a telemetry bed. - Pain management will be provided. - Orthopedic consult will be obtained. - Dr. Romeo Apple was notified and is aware about the patient. - The patient is kept n.p.o. after midnight. - The patient has no history of CVA, coronary artery disease, renal failure with creatinine more than 2 or diabetes mellitus on insulin.  She has a history of CHF.  She is considered above average risk for age  for perioperative cardiovascular risk events according to the revised cardiac risk index.  She has COPD with chronic respiratory failure and hypoxia that we will need close monitoring.  GERD without esophagitis - Okay new H2 blocker therapy.  Dyslipidemia - We will continue  statin therapy.  Anxiety and depression - Continue Xanax and Prozac.    DVT prophylaxis: [place] disease.  Medical prophylaxis is postponed to postoperative period. Advanced Care Planning:  Code Status: The patient is DNR and DNI. Family Communication:  The plan of care was discussed in details with the patient (and family). I answered all questions. The patient agreed to proceed with the above mentioned plan. Further management will depend upon hospital course. Disposition Plan: Back to previous home environment Consults called: Orthopedic surgery All the records are reviewed and case discussed with ED provider.  Status is: Inpatient    At the time of the admission, it appears that the appropriate admission status for this patient is inpatient.  This is judged to be reasonable and necessary in order to provide the required intensity of service to ensure the patient's safety given the presenting symptoms, physical exam findings and initial radiographic and laboratory data in the context of comorbid conditions.  The patient requires inpatient status due to high intensity of service, high risk of further deterioration and high  frequency of surveillance required.  I certify that at the time of admission, it is my clinical judgment that the patient will require inpatient hospital care extending more than 2 midnights.                            Dispo: The patient is from: Home              Anticipated d/c is to: Home              Patient currently is not medically stable to d/c.              Difficult to place patient: No  Hannah Beat M.D on 09/28/2023 at 3:58 AM  [facility]   From 7 PM-7 AM, contact  night-coverage [email or web address]  CC: Primary care physician; Ignatius Specking, MD

## 2023-09-28 NOTE — Progress Notes (Signed)
 PROGRESS NOTE   Maria Stevens  ZHY:[id number] DOB: 07/20/1950 DOA: 09/27/2023 PCP: Ignatius Specking, MD   Chief Complaint  Patient presents with   Fall   Level of care: Telemetry  Brief Admission History:  73 y.o. 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  She denies any presyncope or syncope.  No chest pain or palpitations.  No paresthesias or focal muscle weakness.  No dysuria, oliguria or hematuria or flank pain.  She is on home O2 at all times.  Pt was admitted with acute right hip fracture with plan to go to OR on 09/28/23 with Dr. Romeo Apple.    Assessment and Plan:  Closed nondisplaced intertrochanteric fracture of right femur   - Pain management - Orthopedic consult requested with Dr Romeo Apple - The patient is n.p.o. pending surgery today  GERD without esophagitis - famotidine ordered   Dyslipidemia - rosuvastatin 20 mg daily   Anxiety and depression - Continue Xanax and Prozac.  DVT prophylaxis: per ortho recs Code Status: DNR  Family Communication: husband at bedside updated 3/18 Disposition: TBD    Consultants:  Orthopedics  Procedures:  ORIF planned for 3/18 Antimicrobials:    Subjective: Pt says her right hip is hurting and ringing for RN to bring pain medication.   Objective: Vitals:   09/28/23 0748 09/28/23 0900 09/28/23 1217 09/28/23 1435  BP:  122/83 125/70 110/60  Pulse:  76    Resp:  16 (!) 22 20  Temp:  98.7 F (37.1 C) 98.4 F (36.9 C) (!) 97.5 F (36.4 C)  TempSrc:  Oral    SpO2: 99%  98% 100%  Weight:   40 kg   Height:   5\' 1"  (1.549 m)     Intake/Output Summary (Last 24 hours) at 09/28/2023 1458 Last data filed at 09/28/2023 1424 Gross per 24 hour  Intake 1259.26 ml  Output 250 ml   Net 1009.26 ml   Filed Weights   09/27/23 1844 09/28/23 1217  Weight: 40 kg 40 kg   Examination:  General exam: Appears calm but Uncomfortable  Respiratory system: Clear to auscultation. Respiratory effort normal. Cardiovascular system: normal S1 & S2 heard. No JVD, murmurs, rubs, gallops or clicks. No pedal edema. Gastrointestinal system: Abdomen is nondistended, soft and nontender. No organomegaly or masses felt. Normal bowel sounds heard. Central nervous system: Alert and oriented. No focal neurological deficits. Extremities: warm, shortened, rotated right leg.  Skin: No rashes, lesions or ulcers. Psychiatry: Judgement and insight appear normal. Mood & affect appropriate.   Data Reviewed: I have personally reviewed following labs and imaging studies  CBC: Recent Labs  Lab 09/27/23 1924 09/28/23 0447  WBC 12.2* 9.5  NEUTROABS 9.7*  --   HGB 12.4 11.2*  HCT 37.9 33.3*  MCV 108.6* 108.5*  PLT 211 195    Basic Metabolic Panel: Recent Labs  Lab 09/27/23 1924 09/28/23 0447  NA 138 137  K 4.7 4.1  CL 95* 98  CO2 28 30  GLUCOSE 107* 108*  BUN 22 20  CREATININE 0.96 0.84  CALCIUM 10.1 9.2    CBG: No results for input(s): "GLUCAP" in the last 168 hours.  Recent Results (from the past 240 hours)  Surgical pcr screen     Status: None  Collection Time: 09/28/23 10:50 AM   Specimen: Nasal Mucosa; Nasal Swab  Result Value Ref Range Status   MRSA, PCR NEGATIVE NEGATIVE Final   Staphylococcus aureus NEGATIVE NEGATIVE Final    Comment: (NOTE) The Xpert SA Assay (FDA approved for NASAL specimens in patients 28 years of age and older), is one component of a comprehensive surveillance program. It is not intended to diagnose infection nor to guide or monitor treatment. Performed at [facility], [address] [place], [place] [address]      Radiology Studies: DG C-Arm 1-60 Min-No Report Result Date: 09/28/2023 Fluoroscopy was utilized by the requesting physician.   No radiographic interpretation.   CT 3D RECON AT SCANNER Result Date: 09/27/2023 CLINICAL DATA:  Nonspecific (abnormal) findings on radiological and other examination of musculoskeletal system right hip. EXAM: 3-DIMENSIONAL CT IMAGE RENDERING ON ACQUISITION WORKSTATION TECHNIQUE: 3-dimensional CT images were rendered by post-processing of the original CT data on an acquisition workstation. The 3-dimensional CT images were interpreted and findings were reported in the accompanying complete CT report for this study COMPARISON:  CT scan right hip same date. FINDINGS: Redemonstrated is a comminuted intertrochanteric right hip fracture including the lesser trochanter and fracture fragmentation extending along the greater trochanter. There is less than 1 cortex width of lateral displacement of the main distal fragment. The fracture is also slightly impacted but otherwise nondisplaced. The pelvic ring and right acetabulum are intact. Mild changes of right hip osteoarthritis are redemonstrated. Osteopenia. IMPRESSION: Comminuted intertrochanteric right hip fracture is redemonstrated as described above. Electronically Signed   By: Almira Bar M.D.   On: 09/27/2023 22:09   CT Hip Right Wo Contrast Result Date: 09/27/2023 CLINICAL DATA:  Right hip pain after fall, right femoral neck fracture on x-ray EXAM: CT OF THE RIGHT HIP WITHOUT CONTRAST TECHNIQUE: Multidetector CT imaging of the right hip was performed according to the standard protocol. Multiplanar CT image reconstructions were also generated. RADIATION DOSE REDUCTION: This exam was performed according to the departmental dose-optimization program which includes automated exposure control, adjustment of the mA and/or kV according to patient size and/or use of iterative reconstruction technique. COMPARISON:  09/27/2023 FINDINGS: Bones/Joint/Cartilage There is a mildly comminuted inter trochanteric right hip fracture, with mild impaction and ventral angulation at  the fracture site. The right femoral head articulates normally with the right acetabulum. There are no other acute displaced fractures. Mild right hip osteoarthritis. Ligaments Suboptimally assessed by CT. Muscles and Tendons No gross abnormalities. Soft tissues The soft tissues are grossly unremarkable. Atherosclerosis of the iliac and femoral vessels. Reconstructed images demonstrate no additional findings. IMPRESSION: 1. Comminuted intertrochanteric right hip fracture, with mild impaction and ventral angulation at the fracture site. Electronically Signed   By: Sharlet Salina M.D.   On: 09/27/2023 21:55   DG Hip Unilat With Pelvis 2-3 Views Right Result Date: 09/27/2023 CLINICAL DATA:  Right hip pain after fall EXAM: DG HIP (WITH OR WITHOUT PELVIS) 2-3V RIGHT COMPARISON:  None Available. FINDINGS: Acute nondisplaced right mildly impacted right femoral neck fracture. The femoral head remains seated in the acetabulum. IMPRESSION: Acute nondisplaced right femoral neck fracture. Electronically Signed   By: Minerva Fester M.D.   On: 09/27/2023 20:59   DG Chest 1 View Result Date: 09/27/2023 CLINICAL DATA:  Right hip pain after falling EXAM: CHEST  1 VIEW COMPARISON:  08/03/2022 FINDINGS: Stable cardiomediastinal silhouette. Aortic atherosclerotic calcification. Hyperinflation and chronic bronchitic changes. No focal consolidation, pleural effusion, or pneumothorax. No displaced rib fractures. IMPRESSION: No active  disease. Electronically Signed   By: Minerva Fester M.D.   On: 09/27/2023 20:55   Scheduled Meds:  [MAR Hold] ALPRAZolam  1 mg Oral TID   chlorhexidine  60 mL Topical Once   [MAR Hold] famotidine  40 mg Oral Daily   [START ON 09/29/2023] feeding supplement  237 mL Oral TID BM   [MAR Hold] FLUoxetine  40 mg Oral Daily   [MAR Hold] ipratropium-albuterol  3 mL Nebulization QID   [START ON 09/29/2023] multivitamin with minerals  1 tablet Oral Daily   [MAR Hold] rosuvastatin  20 mg Oral Once per  day on Monday Thursday   [MAR Hold] Vitamin D (Ergocalciferol)  50,000 Units Oral Weekly   Continuous Infusions:  sodium chloride Stopped (09/28/23 1101)   lactated ringers     tranexamic acid       LOS: 1 day   Time spent: 54 mins  Lyan Moyano Laural Benes, MD How to contact the [facility] Attending or Consulting provider 7A - 7P or covering provider during after hours 7P -7A, for this patient?  Check the care team in [facility] and look for a) attending/consulting [facility] provider listed and b) the [facility] team listed Log into [email or web address] to find provider on call.  Locate the [facility] provider you are looking for under [facility] and page to a number that you can be directly reached. If you still have difficulty reaching the provider, please page the [facility] (Director on Call) for the Hospitalists listed on amion for assistance.  09/28/2023, 2:58 PM

## 2023-09-28 NOTE — Assessment & Plan Note (Addendum)
-   The patient will be admitted to a telemetry bed. - Pain management will be provided. - Orthopedic consult will be obtained. - Dr. Romeo Apple was notified and is aware about the patient. - The patient is kept n.p.o. after midnight. - The patient has no history of CVA, coronary artery disease, renal failure with creatinine more than 2 or diabetes mellitus on insulin.  She has a history of CHF.  She is considered above average risk for age for perioperative cardiovascular risk events according to the revised cardiac risk index.  She has COPD with chronic respiratory failure and hypoxia that we will need close monitoring.

## 2023-09-28 NOTE — Brief Op Note (Signed)
 09/27/2023 - 09/28/2023  2:26 PM  PATIENT:  Maria Stevens  73 y.o. female  PRE-OPERATIVE DIAGNOSIS:  Right Hip Fracture  POST-OPERATIVE DIAGNOSIS:  Right Hip Fracture  PROCEDURE:  Procedure(s) with comments: OPEN REDUCTION INTERNAL FIXATION HIP WITH PLATE AND SCREWS (Right) - compression hip, 2 hole plate  SURGEON:  Surgeons and Role:    Vickki Hearing, MD - Primary  PHYSICIAN ASSISTANT:   ASSISTANTS: none   ANESTHESIA:   spinal  EBL:  50 mL   BLOOD ADMINISTERED:none  DRAINS: none   LOCAL MEDICATIONS USED:  MARCAINE     SPECIMEN:  No Specimen  DISPOSITION OF SPECIMEN:  N/A  COUNTS:  YES  TOURNIQUET:  * No tourniquets in log *  DICTATION: .Dragon Dictation  PLAN OF CARE: Admit to inpatient   PATIENT DISPOSITION:  PACU - hemodynamically stable.   Delay start of Pharmacological VTE agent (>24hrs) due to surgical blood loss or risk of bleeding: yes

## 2023-09-28 NOTE — Anesthesia Postprocedure Evaluation (Signed)
 Anesthesia Post Note  Patient: Maria Stevens  Procedure(s) Performed: OPEN REDUCTION INTERNAL FIXATION HIP WITH PLATE AND SCREWS (Right: Hip)  Patient location during evaluation: PACU Anesthesia Type: Spinal Level of consciousness: awake Pain management: pain level controlled Vital Signs Assessment: post-procedure vital signs reviewed and stable Respiratory status: spontaneous breathing and patient connected to nasal cannula oxygen Cardiovascular status: stable Postop Assessment: no headache, no apparent nausea or vomiting and spinal receding Anesthetic complications: no   There were no known notable events for this encounter.   Last Vitals:  Vitals:   09/28/23 1217 09/28/23 1435  BP: 125/70 110/60  Pulse:    Resp: (!) 22 20  Temp: 36.9 C (!) 36.4 C  SpO2: 98% 100%    Last Pain:  Vitals:   09/28/23 1435  TempSrc:   PainSc: 0-No pain                 Kaleem Sartwell L Dasie Chancellor

## 2023-09-28 NOTE — Progress Notes (Signed)
 Patient ID: Maria Stevens, female   DOB: 09-14-1950, 73 y.o.   MRN: [id number]  Right hip fracture   Surgery planned for today after elective cases   Check with OR for time   Keep npo   Stop all anticoagulation

## 2023-09-28 NOTE — Plan of Care (Signed)

## 2023-09-28 NOTE — H&P (View-Only) (Signed)
 Treatment Team:  Consulting Physician: Vickki Hearing, MD   Cleora Fleet, MDAttending  Admit and requesting   Chief Complaint  Patient presents with   Fall   Pain right hip   Fall The accident occurred 12 to 24 hours ago. The fall occurred while standing. She landed on Hard floor. There was no blood loss. The point of impact was the right hip. The pain is present in the right hip. The pain is severe. The symptoms are aggravated by use of injured limb, rotation and movement. Pertinent negatives include no abdominal pain, bowel incontinence, fever, headaches, hearing loss, hematuria, loss of consciousness, nausea, numbness, tingling, visual change or vomiting.   73 yo female fall frequently   Past Medical History:  Diagnosis Date   Colon cancer (HCC)    Reported by patient   Emphysema of lung (HCC)    Hypertension     Past Surgical History:  Procedure Laterality Date   PLACEMENT OF BREAST IMPLANTS     reported by patient    Family History  Problem Relation Age of Onset   Heart disease Mother    Emphysema Father    Allergies Sister    Cancer Brother     Social History   Tobacco Use   Smoking status: Former    Current packs/day: 0.00    Types: Cigarettes    Quit date: 01/30/2021    Years since quitting: 2.6   Smokeless tobacco: Never  Substance Use Topics   Alcohol use: Not Currently    Comment: Stopped drinking 3 weeks ago.    Allergies  Allergen Reactions   Carisoprodol    Homatropine    Hydrocodone    Sulfacetamide    Sulfamethoxazole    Trimethoprim    Aspirin Nausea Only   Current Outpatient Medications  Medication Instructions   ALPRAZolam (XANAX) 1 mg, Oral, 3 times daily   dexamethasone 0.5 MG/5ML elixir 5 mLs, 3 times daily, Sublingually for 3 minutes then spit out   ergocalciferol (VITAMIN D2) 50,000 Units, Weekly   famotidine (PEPCID) 40 mg, Daily   FLUoxetine (PROZAC) 40 mg, Oral, Daily   Fluticasone-Umeclidin-Vilant (TRELEGY  ELLIPTA) 100-62.5-25 MCG/ACT AEPB 1 puff, Inhalation, Daily   rosuvastatin (CRESTOR) 20 mg, Oral, 2 times weekly    BP 139/75   Pulse 83   Temp 98.2 F (36.8 C)   Resp 18   Ht 5\' 1"  (1.549 m)   Wt 40 kg   SpO2 97%   BMI 16.66 kg/m   Physical Exam Vitals and nursing note reviewed.  Constitutional:      General: She is not in acute distress.    Appearance: Normal appearance. She is not ill-appearing, toxic-appearing or diaphoretic.  HENT:     Head: Normocephalic and atraumatic.     Nose: Nose normal. No congestion or rhinorrhea.  Eyes:     General: No scleral icterus.       Right eye: No discharge.        Left eye: No discharge.     Extraocular Movements: Extraocular movements intact.     Conjunctiva/sclera: Conjunctivae normal.     Pupils: Pupils are equal, round, and reactive to light.  Cardiovascular:     Pulses: Normal pulses.  Pulmonary:     Effort: Pulmonary effort is normal.     Breath sounds: No wheezing.  Musculoskeletal:     Right hip: Deformity, tenderness and bony tenderness present. No lacerations or crepitus. Decreased range of motion. Normal strength.  Left hip: No deformity, lacerations, tenderness, bony tenderness or crepitus. Normal range of motion. Normal strength.     Comments: RUE/LUE/LLE  SKIN NORMAL TENDERNESS NONE  ROM NORMAL  MUSCLE TONE NORMAL  INSTABILITY NONE   Skin:    General: Skin is warm and dry.     Capillary Refill: Capillary refill takes less than 2 seconds.     Coloration: Skin is not jaundiced.     Findings: No erythema.  Neurological:     General: No focal deficit present.     Mental Status: She is alert and oriented to person, place, and time.     Gait: Gait abnormal.  Psychiatric:        Mood and Affect: Mood normal.        Behavior: Behavior normal.        Thought Content: Thought content normal.        Judgment: Judgment normal.    CT 3D RECON AT SCANNER Result Date: 09/27/2023 CLINICAL DATA:  Nonspecific  (abnormal) findings on radiological and other examination of musculoskeletal system right hip. EXAM: 3-DIMENSIONAL CT IMAGE RENDERING ON ACQUISITION WORKSTATION TECHNIQUE: 3-dimensional CT images were rendered by post-processing of the original CT data on an acquisition workstation. The 3-dimensional CT images were interpreted and findings were reported in the accompanying complete CT report for this study COMPARISON:  CT scan right hip same date. FINDINGS: Redemonstrated is a comminuted intertrochanteric right hip fracture including the lesser trochanter and fracture fragmentation extending along the greater trochanter. There is less than 1 cortex width of lateral displacement of the main distal fragment. The fracture is also slightly impacted but otherwise nondisplaced. The pelvic ring and right acetabulum are intact. Mild changes of right hip osteoarthritis are redemonstrated. Osteopenia. IMPRESSION: Comminuted intertrochanteric right hip fracture is redemonstrated as described above. Electronically Signed   By: Almira Bar M.D.   On: 09/27/2023 22:09   CT Hip Right Wo Contrast Result Date: 09/27/2023 CLINICAL DATA:  Right hip pain after fall, right femoral neck fracture on x-ray EXAM: CT OF THE RIGHT HIP WITHOUT CONTRAST TECHNIQUE: Multidetector CT imaging of the right hip was performed according to the standard protocol. Multiplanar CT image reconstructions were also generated. RADIATION DOSE REDUCTION: This exam was performed according to the departmental dose-optimization program which includes automated exposure control, adjustment of the mA and/or kV according to patient size and/or use of iterative reconstruction technique. COMPARISON:  09/27/2023 FINDINGS: Bones/Joint/Cartilage There is a mildly comminuted inter trochanteric right hip fracture, with mild impaction and ventral angulation at the fracture site. The right femoral head articulates normally with the right acetabulum. There are no other  acute displaced fractures. Mild right hip osteoarthritis. Ligaments Suboptimally assessed by CT. Muscles and Tendons No gross abnormalities. Soft tissues The soft tissues are grossly unremarkable. Atherosclerosis of the iliac and femoral vessels. Reconstructed images demonstrate no additional findings. IMPRESSION: 1. Comminuted intertrochanteric right hip fracture, with mild impaction and ventral angulation at the fracture site. Electronically Signed   By: Sharlet Salina M.D.   On: 09/27/2023 21:55   DG Hip Unilat With Pelvis 2-3 Views Right Result Date: 09/27/2023 CLINICAL DATA:  Right hip pain after fall EXAM: DG HIP (WITH OR WITHOUT PELVIS) 2-3V RIGHT COMPARISON:  None Available. FINDINGS: Acute nondisplaced right mildly impacted right femoral neck fracture. The femoral head remains seated in the acetabulum. IMPRESSION: Acute nondisplaced right femoral neck fracture. Electronically Signed   By: Minerva Fester M.D.   On: 09/27/2023 20:59   DG Chest  1 View Result Date: 09/27/2023 CLINICAL DATA:  Right hip pain after falling EXAM: CHEST  1 VIEW COMPARISON:  08/03/2022 FINDINGS: Stable cardiomediastinal silhouette. Aortic atherosclerotic calcification. Hyperinflation and chronic bronchitic changes. No focal consolidation, pleural effusion, or pneumothorax. No displaced rib fractures. IMPRESSION: No active disease. Electronically Signed   By: Minerva Fester M.D.   On: 09/27/2023 20:55     ASSESSMENT   IMAGES  XRAYS I will interpret the images chest x-ray plain film report only no active disease.                                                                                                                        AP pelvis AP lateral right hip inconclusive for fracture seen in the trochanteric region recommend CT scan  2. CT I will enter the CT: CT scan shows intertrochanteric type fracture of the right hip basicervical variant.  CT 3D images also include        Latest Ref Rng & Units 09/28/2023     4:47 AM 09/27/2023    7:24 PM  BMP  Glucose 70 - 99 mg/dL 161  096   BUN 8 - 23 mg/dL 20  22   Creatinine 0.45 - 1.00 mg/dL 4.09  8.11   Sodium 914 - 145 mmol/L 137  138   Potassium 3.5 - 5.1 mmol/L 4.1  4.7   Chloride 98 - 111 mmol/L 98  95   CO2 22 - 32 mmol/L 30  28   Calcium 8.9 - 10.3 mg/dL 9.2  78.2       Latest Ref Rng & Units 09/28/2023    4:47 AM 09/27/2023    7:24 PM  CBC  WBC 4.0 - 10.5 K/uL 9.5  12.2   Hemoglobin 12.0 - 15.0 g/dL 95.6  21.3   Hematocrit 36.0 - 46.0 % 33.3  37.9   Platelets 150 - 400 K/uL 195  211      Plan:   COPD 4 L O2   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 She has had hysterectomy and breast implants, and recent tooth extraction WITHOUT ANY BLEEDING  COAGS ARE NORMAL   DR EWELL from the department of ANESTHESIA  is ok with spinal  orif right hip with screw and side plate   The procedure has been fully reviewed with the patient; The risks and benefits of surgery have been discussed and explained and understood. Alternative treatment has also been reviewed, questions were encouraged and answered. The postoperative plan is also been reviewed.

## 2023-09-28 NOTE — Interval H&P Note (Signed)
 History and Physical Interval Note:  09/28/2023 12:40 PM  Maria Stevens  has presented today for surgery, with the diagnosis of Right Hip Fracture.  The various methods of treatment have been discussed with the patient and family. After consideration of risks, benefits and other options for treatment, the patient has consented to  Procedure(s) with comments: OPEN REDUCTION INTERNAL FIXATION HIP WITH PLATE AND SCREWS (Right) - compression hip, 2 hole plate as a surgical intervention.  The patient's history has been reviewed, patient examined, no change in status, stable for surgery.  I have reviewed the patient's chart and labs.  Questions were answered to the patient's satisfaction.     Fuller Canada

## 2023-09-28 NOTE — Anesthesia Procedure Notes (Signed)
 Date/Time: 09/28/2023 1:08 PM  Performed by: Julian Reil, CRNAPre-anesthesia Checklist: Patient identified, Emergency Drugs available, Suction available and Patient being monitored Patient Re-evaluated:Patient Re-evaluated prior to induction Oxygen Delivery Method: Simple face mask Induction Type: IV induction Placement Confirmation: positive ETCO2

## 2023-09-28 NOTE — Transfer of Care (Signed)
 Immediate Anesthesia Transfer of Care Note  Patient: Maria Stevens  Procedure(s) Performed: OPEN REDUCTION INTERNAL FIXATION HIP WITH PLATE AND SCREWS (Right: Hip)  Patient Location: PACU  Anesthesia Type:Regional  Level of Consciousness: awake, alert , patient cooperative, and confused  Airway & Oxygen Therapy: Patient Spontanous Breathing and Patient connected to face mask oxygen  Post-op Assessment: Report given to RN, Post -op Vital signs reviewed and stable, and Patient moving all extremities X 4  Post vital signs: Reviewed and stable  Last Vitals:  Vitals Value Taken Time  BP 110/60   Temp 97.5   Pulse 95   Resp 12   SpO2 99     Last Pain:  Vitals:   09/28/23 1217  TempSrc:   PainSc: 9       Patients Stated Pain Goal: 8 (09/28/23 1217)  Complications: No notable events documented.

## 2023-09-28 NOTE — Op Note (Signed)
 09/28/2023  2:26 PM  PATIENT:  Maria Stevens  73 y.o. female  PRE-OPERATIVE DIAGNOSIS:  Right Hip Fracture  POST-OPERATIVE DIAGNOSIS:  Right Hip Fracture  PROCEDURE:  Procedure(s) with comments: OPEN REDUCTION INTERNAL FIXATION HIP WITH PLATE AND SCREWS (Right) - compression hip, 2 hole plate  SURGEON:  Surgeons and Role:    Vickki Hearing, MD - Primary  PHYSICIAN ASSISTANT:   ASSISTANTS: none   ANESTHESIA:   spinal  EBL:  50 mL   BLOOD ADMINISTERED:none  DRAINS: none   LOCAL MEDICATIONS USED:  MARCAINE     SPECIMEN:  No Specimen  DISPOSITION OF SPECIMEN:  N/A  COUNTS:  YES  TOURNIQUET:  * No tourniquets in log *  DICTATION: .Dragon Dictation  PLAN OF CARE: Admit to inpatient   PATIENT DISPOSITION:  PACU - hemodynamically stable.   Delay start of Pharmacological VTE agent (>24hrs) due to surgical blood loss or risk of bleeding: yes   Open treatment internal fixation of the hip with dynamic hip screw   Orthopaedic Surgery Operative Note (CSN: [id number])  Maria Stevens  21-Dec-1950 Date of Surgery: 09/27/2023 - 09/28/2023    Implants: Implant Name Type Inv. Item Serial No. Manufacturer Lot No. LRB No. Used Action  PLATE 850 DEGREE 2HOLE - [id number] Plate PLATE 767 DEGREE 2HOLE  [employer] [id number] Right 1 Implanted  CHS LAG SCREW 80 MM Screw    [id number] Right 1 Implanted  SCREW COMPRESSION 19.34M - [id number] Screw SCREW COMPRESSION 19.34M  [employer] [id number] Right 1 Implanted  SCREW CORTICAL SFTP 4.5X38MM - [id number] Screw SCREW CORTICAL SFTP 4.5X38MM  [employer] STERILE ON SET Right 1 Implanted     The surgery was done as follows The patient was identified in the preop area the surgical site was confirmed and marked after thorough chart review including radiographs, implants were checked and available   The patient was brought back to the operating room for SPINAL anesthesia and then was  placed on the fracture table. The operative leg was placed in traction the nonoperative leg was padded and placed in abduction external rotation in a padded leg holder.  The C-arm was brought in and a closed reduction was performed by traction and internal rotation. Multiplane x-rays were taken to confirm reduction.  Once reduction was confirmed we proceeded in the following manner:   The leg was prepped and draped using sterile technique, this was followed by timeout which confirmed surgical site, implants, x-ray gowns and badges.  The incision was made over the right hip at the greater trochanter and extended distally. The subcutaneous tissue was divided down to fascia which was then split in line with the skin incision. The vastus lateralis musculature was divided with electrocautery which was also used to coagulate perforating vessels. Subperiosteal dissection exposed the proximal femur.  A smooth K wire was placed into the femoral neck and head and manipulated until it was in the center inferior position on  AP and center lateral x-ray with an appropriate tip apex distance. This was measured (80 mm) and overdrilled with a triple reamer.  The lag screw was applied and advanced to the subchondral bone with the tip to apex distance on AP and lateral x-ray less than 24 mm. The 2 hole 130 degree plate was applied starting at the proximal hole and advancing distally using AO technique.  2 screws were inserted, with the first screw placed in compression mode with traction released  Final radiographs were obtained and a stable reduction with appropriate plate position was confirmed.  The wound was irrigated with copious amounts of saline and closed in layered fashion starting with 0 Monocryl, #1 Bralon, 2-0 Monocryl. We used 30 mL of Marcaine with epinephrine dividing it between each layer  A sterile bandage was applied  Postoperative plan Weightbearing as tolerated Wound check in 14 days   anticoagulation for 28 days Follow-up visit at 2 weeks for x-rays and then x-rays at 6 weeks and 12 weeks [id number]

## 2023-09-28 NOTE — TOC Initial Note (Signed)
 Transition of Care [facility]) - Initial/Assessment Note    Patient Details  Name: Maria Stevens MRN: [id number] Date of Birth: 04-14-1951  [facility] CM/SW Contact:    Karn Cassis, LCSW Phone Number: 09/28/2023, 9:14 AM  Clinical Narrative:  Pt admitted due to hip fracture after fall at home. Pt scheduled for surgery this afternoon. Per pt's husband, pt requires some assist with ADLs at baseline. She ambulates with a cane. Pt is on 3L home O2 ([employer]). Discussed pt will likely need SNF following surgery. Husband indicates pt has been to [facility] in the past for rehab. TOC will plan to follow up with pt/husband tomorrow after PT makes recommendations.                  Expected Discharge Plan: Skilled Nursing Facility     Patient Goals and CMS Choice Patient states their goals for this hospitalization and ongoing recovery are:: anticipate SNF   Choice offered to / list presented to : Spouse [employer] ownership interest in [facility].provided to::  (will discuss if SNF recommended)    Expected Discharge Plan and Services In-house Referral: Clinical Social Work     Living arrangements for the past 2 months: Single Family Home                                      Prior Living Arrangements/Services Living arrangements for the past 2 months: Single Family Home Lives with:: Spouse Patient language and need for interpreter reviewed:: Yes Do you feel safe going back to the place where you live?: Yes      Need for Family Participation in Patient Care: Yes (Comment) Care giver support system in place?: Yes (comment) Current home services: DME (home O2, cane, walker, BSC, hospital bed) Criminal Activity/Legal Involvement Pertinent to Current Situation/Hospitalization: No - Comment as needed  Activities of Daily Living   ADL Screening (condition at time of admission) Independently performs ADLs?: No Does the patient have a NEW difficulty  with bathing/dressing/toileting/self-feeding that is expected to last >3 days?: Yes (Initiates electronic notice to provider for possible OT consult) Does the patient have a NEW difficulty with getting in/out of bed, walking, or climbing stairs that is expected to last >3 days?: Yes (Initiates electronic notice to provider for possible PT consult) Does the patient have a NEW difficulty with communication that is expected to last >3 days?: Yes (Initiates electronic notice to provider for possible SLP consult) Is the patient deaf or have difficulty hearing?: No Does the patient have difficulty seeing, even when wearing glasses/contacts?: No Does the patient have difficulty concentrating, remembering, or making decisions?: Yes  Permission Sought/Granted                  Emotional Assessment         Alcohol / Substance Use: Not Applicable Psych Involvement: No (comment)  Admission diagnosis:  Closed right hip fracture (HCC) [S72.001A] Closed fracture of neck of right femur, initial encounter (HCC) [S72.001A] Patient Active Problem List   Diagnosis Date Noted   Anxiety and depression 09/28/2023   Dyslipidemia 09/28/2023   GERD without esophagitis 09/28/2023   Closed right hip fracture (HCC) 09/27/2023   Colonic mass 06/12/2021   Abnormal CT of the chest 02/20/2021   COPD (chronic obstructive pulmonary disease) (HCC) 02/20/2021   Chronic hypoxemic respiratory failure (HCC) 02/20/2021   Ruptured right breast implant,  initial encounter 10/18/2018   Rupture of implant of left breast 10/18/2018   PCP:  Ignatius Specking, MD Pharmacy:   [facility] - [place], [place] - [address] [place] [address] Phone: [phone] Fax: [phone]     Social Drivers of Health (SDOH) Social History: SDOH Screenings   Food Insecurity: No Food Insecurity (09/27/2023)  Housing: Low Risk  (09/27/2023)  Transportation Needs: No Transportation Needs (09/27/2023)  Utilities:  Not At Risk (09/27/2023)  Financial Resource Strain: Low Risk  (11/17/2022)   Received from [facility], [facility]  Physical Activity: Insufficiently Active (02/26/2021)   Received from [facility], [facility]  Social Connections: Socially Integrated (09/27/2023)  Stress: No Stress Concern Present (02/26/2021)   [facility] from [facility], [facility]  Tobacco Use: Medium Risk (09/27/2023)  Health Literacy: Medium Risk (02/26/2021)   Received from [facility], [facility]   SDOH Interventions: Utilities Interventions: Intervention Not Indicated   Readmission Risk Interventions     No data to display

## 2023-09-28 NOTE — Assessment & Plan Note (Signed)
-   Continue Xanax and Prozac

## 2023-09-28 NOTE — Assessment & Plan Note (Addendum)
 -  We will continue statin therapy.

## 2023-09-28 NOTE — Assessment & Plan Note (Signed)
-   Okay new H2 blocker therapy.

## 2023-09-28 NOTE — Consult Note (Signed)
 Treatment Team:  Consulting Physician: Vickki Hearing, MD   Cleora Fleet, MDAttending  Admit and requesting   Chief Complaint  Patient presents with   Fall   Pain right hip   Fall The accident occurred 12 to 24 hours ago. The fall occurred while standing. She landed on Hard floor. There was no blood loss. The point of impact was the right hip. The pain is present in the right hip. The pain is severe. The symptoms are aggravated by use of injured limb, rotation and movement. Pertinent negatives include no abdominal pain, bowel incontinence, fever, headaches, hearing loss, hematuria, loss of consciousness, nausea, numbness, tingling, visual change or vomiting.   73 yo female fall frequently   Past Medical History:  Diagnosis Date   Colon cancer (HCC)    Reported by patient   Emphysema of lung (HCC)    Hypertension     Past Surgical History:  Procedure Laterality Date   PLACEMENT OF BREAST IMPLANTS     reported by patient    Family History  Problem Relation Age of Onset   Heart disease Mother    Emphysema Father    Allergies Sister    Cancer Brother     Social History   Tobacco Use   Smoking status: Former    Current packs/day: 0.00    Types: Cigarettes    Quit date: 01/30/2021    Years since quitting: 2.6   Smokeless tobacco: Never  Substance Use Topics   Alcohol use: Not Currently    Comment: Stopped drinking 3 weeks ago.    Allergies  Allergen Reactions   Carisoprodol    Homatropine    Hydrocodone    Sulfacetamide    Sulfamethoxazole    Trimethoprim    Aspirin Nausea Only   Current Outpatient Medications  Medication Instructions   ALPRAZolam (XANAX) 1 mg, Oral, 3 times daily   dexamethasone 0.5 MG/5ML elixir 5 mLs, 3 times daily, Sublingually for 3 minutes then spit out   ergocalciferol (VITAMIN D2) 50,000 Units, Weekly   famotidine (PEPCID) 40 mg, Daily   FLUoxetine (PROZAC) 40 mg, Oral, Daily   Fluticasone-Umeclidin-Vilant (TRELEGY  ELLIPTA) 100-62.5-25 MCG/ACT AEPB 1 puff, Inhalation, Daily   rosuvastatin (CRESTOR) 20 mg, Oral, 2 times weekly    BP 139/75   Pulse 83   Temp 98.2 F (36.8 C)   Resp 18   Ht 5\' 1"  (1.549 m)   Wt 40 kg   SpO2 97%   BMI 16.66 kg/m   Physical Exam Vitals and nursing note reviewed.  Constitutional:      General: She is not in acute distress.    Appearance: Normal appearance. She is not ill-appearing, toxic-appearing or diaphoretic.  HENT:     Head: Normocephalic and atraumatic.     Nose: Nose normal. No congestion or rhinorrhea.  Eyes:     General: No scleral icterus.       Right eye: No discharge.        Left eye: No discharge.     Extraocular Movements: Extraocular movements intact.     Conjunctiva/sclera: Conjunctivae normal.     Pupils: Pupils are equal, round, and reactive to light.  Cardiovascular:     Pulses: Normal pulses.  Pulmonary:     Effort: Pulmonary effort is normal.     Breath sounds: No wheezing.  Musculoskeletal:     Right hip: Deformity, tenderness and bony tenderness present. No lacerations or crepitus. Decreased range of motion. Normal strength.  Left hip: No deformity, lacerations, tenderness, bony tenderness or crepitus. Normal range of motion. Normal strength.     Comments: RUE/LUE/LLE  SKIN NORMAL TENDERNESS NONE  ROM NORMAL  MUSCLE TONE NORMAL  INSTABILITY NONE   Skin:    General: Skin is warm and dry.     Capillary Refill: Capillary refill takes less than 2 seconds.     Coloration: Skin is not jaundiced.     Findings: No erythema.  Neurological:     General: No focal deficit present.     Mental Status: She is alert and oriented to person, place, and time.     Gait: Gait abnormal.  Psychiatric:        Mood and Affect: Mood normal.        Behavior: Behavior normal.        Thought Content: Thought content normal.        Judgment: Judgment normal.    CT 3D RECON AT SCANNER Result Date: 09/27/2023 CLINICAL DATA:  Nonspecific  (abnormal) findings on radiological and other examination of musculoskeletal system right hip. EXAM: 3-DIMENSIONAL CT IMAGE RENDERING ON ACQUISITION WORKSTATION TECHNIQUE: 3-dimensional CT images were rendered by post-processing of the original CT data on an acquisition workstation. The 3-dimensional CT images were interpreted and findings were reported in the accompanying complete CT report for this study COMPARISON:  CT scan right hip same date. FINDINGS: Redemonstrated is a comminuted intertrochanteric right hip fracture including the lesser trochanter and fracture fragmentation extending along the greater trochanter. There is less than 1 cortex width of lateral displacement of the main distal fragment. The fracture is also slightly impacted but otherwise nondisplaced. The pelvic ring and right acetabulum are intact. Mild changes of right hip osteoarthritis are redemonstrated. Osteopenia. IMPRESSION: Comminuted intertrochanteric right hip fracture is redemonstrated as described above. Electronically Signed   By: Almira Bar M.D.   On: 09/27/2023 22:09   CT Hip Right Wo Contrast Result Date: 09/27/2023 CLINICAL DATA:  Right hip pain after fall, right femoral neck fracture on x-ray EXAM: CT OF THE RIGHT HIP WITHOUT CONTRAST TECHNIQUE: Multidetector CT imaging of the right hip was performed according to the standard protocol. Multiplanar CT image reconstructions were also generated. RADIATION DOSE REDUCTION: This exam was performed according to the departmental dose-optimization program which includes automated exposure control, adjustment of the mA and/or kV according to patient size and/or use of iterative reconstruction technique. COMPARISON:  09/27/2023 FINDINGS: Bones/Joint/Cartilage There is a mildly comminuted inter trochanteric right hip fracture, with mild impaction and ventral angulation at the fracture site. The right femoral head articulates normally with the right acetabulum. There are no other  acute displaced fractures. Mild right hip osteoarthritis. Ligaments Suboptimally assessed by CT. Muscles and Tendons No gross abnormalities. Soft tissues The soft tissues are grossly unremarkable. Atherosclerosis of the iliac and femoral vessels. Reconstructed images demonstrate no additional findings. IMPRESSION: 1. Comminuted intertrochanteric right hip fracture, with mild impaction and ventral angulation at the fracture site. Electronically Signed   By: Sharlet Salina M.D.   On: 09/27/2023 21:55   DG Hip Unilat With Pelvis 2-3 Views Right Result Date: 09/27/2023 CLINICAL DATA:  Right hip pain after fall EXAM: DG HIP (WITH OR WITHOUT PELVIS) 2-3V RIGHT COMPARISON:  None Available. FINDINGS: Acute nondisplaced right mildly impacted right femoral neck fracture. The femoral head remains seated in the acetabulum. IMPRESSION: Acute nondisplaced right femoral neck fracture. Electronically Signed   By: Minerva Fester M.D.   On: 09/27/2023 20:59   DG Chest  1 View Result Date: 09/27/2023 CLINICAL DATA:  Right hip pain after falling EXAM: CHEST  1 VIEW COMPARISON:  08/03/2022 FINDINGS: Stable cardiomediastinal silhouette. Aortic atherosclerotic calcification. Hyperinflation and chronic bronchitic changes. No focal consolidation, pleural effusion, or pneumothorax. No displaced rib fractures. IMPRESSION: No active disease. Electronically Signed   By: Minerva Fester M.D.   On: 09/27/2023 20:55     ASSESSMENT   IMAGES  XRAYS I will interpret the images chest x-ray plain film report only no active disease.                                                                                                                        AP pelvis AP lateral right hip inconclusive for fracture seen in the trochanteric region recommend CT scan  2. CT I will enter the CT: CT scan shows intertrochanteric type fracture of the right hip basicervical variant.  CT 3D images also include        Latest Ref Rng & Units 09/28/2023     4:47 AM 09/27/2023    7:24 PM  BMP  Glucose 70 - 99 mg/dL 161  096   BUN 8 - 23 mg/dL 20  22   Creatinine 0.45 - 1.00 mg/dL 4.09  8.11   Sodium 914 - 145 mmol/L 137  138   Potassium 3.5 - 5.1 mmol/L 4.1  4.7   Chloride 98 - 111 mmol/L 98  95   CO2 22 - 32 mmol/L 30  28   Calcium 8.9 - 10.3 mg/dL 9.2  78.2       Latest Ref Rng & Units 09/28/2023    4:47 AM 09/27/2023    7:24 PM  CBC  WBC 4.0 - 10.5 K/uL 9.5  12.2   Hemoglobin 12.0 - 15.0 g/dL 95.6  21.3   Hematocrit 36.0 - 46.0 % 33.3  37.9   Platelets 150 - 400 K/uL 195  211      Plan:   COPD 4 L O2   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 She has had hysterectomy and breast implants, and recent tooth extraction WITHOUT ANY BLEEDING  COAGS ARE NORMAL   DR EWELL from the department of ANESTHESIA  is ok with spinal  orif right hip with screw and side plate   The procedure has been fully reviewed with the patient; The risks and benefits of surgery have been discussed and explained and understood. Alternative treatment has also been reviewed, questions were encouraged and answered. The postoperative plan is also been reviewed.

## 2023-09-28 NOTE — Progress Notes (Signed)
 Initial Nutrition Assessment  DOCUMENTATION CODES:   Underweight  INTERVENTION:   Ensure Enlive po TID, each supplement provides 350 kcal and 20 grams of protein. Magic cup TID with meals, each supplement provides 290 kcal and 9 grams of protein. MVI with minerals daily.  NUTRITION DIAGNOSIS:   Increased nutrient needs related to hip fracture, post-op healing as evidenced by estimated needs.  GOAL:   Patient will meet greater than or equal to 90% of their needs  MONITOR:   PO intake, Supplement acceptance  REASON FOR ASSESSMENT:   Consult Hip fracture protocol  ASSESSMENT:   73 yo female admitted with R hip fracture s/p mechanical fall at home. PMH includes HTN, emphysema of lung on home oxygen, colon cancer.  Patient is currently in the OR for ORIF of R hip with plates and screws.  She has been NPO since admission on 3/17.   Weight history reviewed. 14% weight loss within the past year noted. Suspect patient is malnourished with BMI 16.7. Unable to obtain enough information at this time for identification of malnutrition.   Labs and medications reviewed.   Patient will benefit from PO supplements to maximize intake of protein and calories to support healing and recovery.   NUTRITION - FOCUSED PHYSICAL EXAM:  Unable to complete  Diet Order:   Diet Order             Diet NPO time specified  Diet effective ____           Diet NPO time specified  Diet effective now                   EDUCATION NEEDS:   No education needs have been identified at this time  Skin:  Skin Assessment: Reviewed RN Assessment  Last BM:  3/16  Height:   Ht Readings from Last 1 Encounters:  09/28/23 5\' 1"  (1.549 m)    Weight:   Wt Readings from Last 1 Encounters:  09/28/23 40 kg    Ideal Body Weight:  47.7 kg  BMI:  Body mass index is 16.66 kg/m.  Estimated Nutritional Needs:   Kcal:  1300-1500  Protein:  65-75 gm  Fluid:  >/= 1.5 L   Gabriel Rainwater RD,  LDN, CNSC Contact via secure chat. If unavailable, use group chat "RD Inpatient."

## 2023-09-29 ENCOUNTER — Encounter (HOSPITAL_COMMUNITY): Payer: Self-pay | Admitting: Orthopedic Surgery

## 2023-09-29 DIAGNOSIS — J9611 Chronic respiratory failure with hypoxia: Secondary | ICD-10-CM | POA: Diagnosis not present

## 2023-09-29 DIAGNOSIS — K219 Gastro-esophageal reflux disease without esophagitis: Secondary | ICD-10-CM | POA: Diagnosis not present

## 2023-09-29 DIAGNOSIS — S72144S Nondisplaced intertrochanteric fracture of right femur, sequela: Secondary | ICD-10-CM

## 2023-09-29 LAB — BASIC METABOLIC PANEL
Anion gap: 7 (ref 5–15)
BUN: 16 mg/dL (ref 8–23)
CO2: 28 mmol/L (ref 22–32)
Calcium: 8.8 mg/dL — ABNORMAL LOW (ref 8.9–10.3)
Chloride: 100 mmol/L (ref 98–111)
Creatinine, Ser: 0.76 mg/dL (ref 0.44–1.00)
GFR, Estimated: >60 mL/min (ref >60)
Glucose, Bld: 124 mg/dL — ABNORMAL HIGH (ref 70–99)
Potassium: 3.9 mmol/L (ref 3.5–5.1)
Sodium: 135 mmol/L (ref 135–145)

## 2023-09-29 LAB — CBC
HCT: 28.6 % — ABNORMAL LOW (ref 36.0–46.0)
Hemoglobin: 9.3 g/dL — ABNORMAL LOW (ref 12.0–15.0)
MCH: 35.8 pg — ABNORMAL HIGH (ref 26.0–34.0)
MCHC: 32.5 g/dL (ref 30.0–36.0)
MCV: 110 fL — ABNORMAL HIGH (ref 80.0–100.0)
Platelets: 165 10*3/uL (ref 150–400)
RBC: 2.6 MIL/uL — ABNORMAL LOW (ref 3.87–5.11)
RDW: 12 % (ref 11.5–15.5)
WBC: 14.4 10*3/uL — ABNORMAL HIGH (ref 4.0–10.5)
nRBC: 0 % (ref 0.0–0.2)

## 2023-09-29 MED ORDER — THIAMINE MONONITRATE 100 MG PO TABS
100.0000 mg | ORAL_TABLET | Freq: Every day | ORAL | Status: DC
  Administered 2023-09-29 – 2023-10-02 (×4): 100 mg via ORAL
  Filled 2023-09-29 (×4): qty 1

## 2023-09-29 MED ORDER — BUDESONIDE 0.5 MG/2ML IN SUSP
0.5000 mg | Freq: Two times a day (BID) | RESPIRATORY_TRACT | Status: DC
  Administered 2023-09-29 – 2023-10-02 (×6): 0.5 mg via RESPIRATORY_TRACT
  Filled 2023-09-29 (×8): qty 2

## 2023-09-29 MED ORDER — LACTATED RINGERS IV BOLUS
500.0000 mL | Freq: Once | INTRAVENOUS | Status: AC
  Administered 2023-09-29: 500 mL via INTRAVENOUS

## 2023-09-29 MED ORDER — ARFORMOTEROL TARTRATE 15 MCG/2ML IN NEBU
15.0000 ug | INHALATION_SOLUTION | Freq: Two times a day (BID) | RESPIRATORY_TRACT | Status: DC
  Administered 2023-09-29 – 2023-10-02 (×6): 15 ug via RESPIRATORY_TRACT
  Filled 2023-09-29 (×8): qty 2

## 2023-09-29 NOTE — Progress Notes (Signed)
 PROGRESS NOTE  Maria Stevens:[id number] DOB: [birth year] DOA: 09/27/2023 PCP: Ignatius Specking, MD  Brief History:  73 y.o. 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  She denies any presyncope or syncope.  No chest pain or palpitations.  No paresthesias or focal muscle weakness.  No dysuria, oliguria or hematuria or flank pain.  She is on home O2 at all times.  Pt was admitted with acute right hip fracture with plan to go to OR on 09/28/23 with Dr. Romeo Apple.  Patient underwent ORIF on 09/28/2023.  PT evaluation recommended skilled nursing facility with which the patient's family agreed.  The patient continued to be dependent on 3 L nasal cannula which is her home demand.  The patient was started on bronchodilators for her COPD.  She has continued to smoke at home.  The patient continued to drink alcohol although it was unclear when her last drink was.  There was no signs of alcohol withdrawal.   Assessment/Plan: Closed intertrochanteric fracture of the right femur -Status post ORIF 09/28/2023 -PT evaluation>> skilled nursing facility -Judicious opioids -continue DVT prophylaxis  Chronic respiratory failure with hypoxia -Patient is chronically on 3 L nasal cannula at home -Stable presently   Alcohol dependence/abuse -Start thiamine -No signs of withdrawal  COPD -Continue DuoNebs -Start Pulmicort  Tobacco abuse -Tobacco cessation discussed  Anxiety/depression -Continue home dose alprazolam -PDMP reviewed--alprazolam 1 mg, #90, last refill 09/14/2023 -Continue fluoxetine  GERD -Continue famotidine  Mixed hyperlipidemia -Continue statin  Cognitive impairment -Daughter endorses cognitive impairment at  baseline         Family Communication:   daughter at bedside 3/19  Consultants:  ortho  Code Status:  DNR  DVT Prophylaxis:  [place] Lovenox   Procedures: As Listed in Progress Note Above  Antibiotics: None       Subjective: Patient complains of right leg pain.  She denies any chest pain, shortness breath, abdominal pain, nausea, vomiting, diarrhea.  Objective: Vitals:   09/29/23 0810 09/29/23 1010 09/29/23 1425 09/29/23 1517  BP:  105/69 (!) 93/51 98/67  Pulse:  (!) 108 (!) 104 91  Resp:  19    Temp:  97.9 F (36.6 C) 97.8 F (36.6 C) 98.2 F (36.8 C)  TempSrc:  Oral  Oral  SpO2: 100% 100% 100% 100%  Weight:      Height:        Intake/Output Summary (Last 24 hours) at 09/29/2023 1546 Last data filed at 09/29/2023 0900 Gross per 24 hour  Intake 1326.9 ml  Output 500 ml  Net 826.9 ml   Weight change: 0 kg Exam:  General:  Pt is alert, follows commands appropriately, not in acute distress HEENT: No icterus, No thrush, No neck mass, [place]/AT Cardiovascular: RRR, S1/S2, no rubs, no gallops Respiratory: Diminished breath sounds bilateral.  Bibasilar rales.  No wheezing. Abdomen: Soft/+BS, non tender, non distended, no guarding Extremities: No edema, No lymphangitis, No petechiae, No rashes, no synovitis   Data Reviewed: I have personally reviewed following labs and imaging studies Basic Metabolic Panel: Recent Labs  Lab 09/27/23 1924 09/28/23 0447 09/29/23 0513  NA 138 137 135  K 4.7 4.1 3.9  CL 95* 98 100  CO2 28 30 28  GLUCOSE 107* 108* 124*  BUN 22 20 16   CREATININE 0.96 0.84 0.76  CALCIUM 10.1 9.2 8.8*   Liver Function Tests: No results for input(s): "AST", "ALT", "ALKPHOS", "BILITOT", "PROT", "ALBUMIN" in the last 168 hours. No results for input(s): "LIPASE", "AMYLASE" in the last 168 hours. No results for input(s): "AMMONIA" in the last 168 hours. Coagulation Profile: Recent Labs  Lab 09/27/23 2008  INR 0.9   CBC: Recent Labs  Lab  09/27/23 1924 09/28/23 0447 09/29/23 0513  WBC 12.2* 9.5 14.4*  NEUTROABS 9.7*  --   --   HGB 12.4 11.2* 9.3*  HCT 37.9 33.3* 28.6*  MCV 108.6* 108.5* 110.0*  PLT 211 195 165   Cardiac Enzymes: No results for input(s): "CKTOTAL", "CKMB", "CKMBINDEX", "TROPONINI" in the last 168 hours. BNP: Invalid input(s): "POCBNP" CBG: No results for input(s): "GLUCAP" in the last 168 hours. HbA1C: No results for input(s): "HGBA1C" in the last 72 hours. Urine analysis: No results found for: "COLORURINE", "APPEARANCEUR", "LABSPEC", "PHURINE", "GLUCOSEU", "HGBUR", "BILIRUBINUR", "KETONESUR", "PROTEINUR", "UROBILINOGEN", "NITRITE", "LEUKOCYTESUR" Sepsis Labs: @LABRCNTIP (procalcitonin:4,lacticidven:4) ) Recent Results (from the past 240 hours)  Surgical pcr screen     Status: None   Collection Time: 09/28/23 10:50 AM   Specimen: Nasal Mucosa; Nasal Swab  Result Value Ref Range Status   MRSA, PCR NEGATIVE NEGATIVE Final   Staphylococcus aureus NEGATIVE NEGATIVE Final    Comment: (NOTE) The Xpert SA Assay (FDA approved for NASAL specimens in patients 58 years of age and older), is one component of a comprehensive surveillance program. It is not intended to diagnose infection nor to guide or monitor treatment. Performed at [facility], [address], [place], [place] [address]      Scheduled Meds:  acetaminophen  650 mg Oral Q6H   ALPRAZolam  1 mg Oral TID   chlorhexidine  15 mL Mouth/Throat QID   Chlorhexidine Gluconate Cloth  6 each Topical Daily   docusate sodium  100 mg Oral BID   enoxaparin (LOVENOX) injection  30 mg Subcutaneous Q24H   famotidine  40 mg Oral Daily   feeding supplement  237 mL Oral TID BM   FLUoxetine  40 mg Oral Daily   ipratropium-albuterol  3 mL Nebulization BID   multivitamin with minerals  1 tablet Oral Daily   [START ON 09/30/2023] rosuvastatin  20 mg Oral Once per day on Monday Thursday   Vitamin D (Ergocalciferol)  50,000 Units Oral Weekly   Continuous  Infusions:  Procedures/Studies: DG HIP UNILAT WITH PELVIS 2-3 VIEWS RIGHT Result Date: 09/28/2023 CLINICAL DATA:  Operative fixation of an intertrochanteric right hip fracture. EXAM: DG HIP (WITH OR WITHOUT PELVIS) 2-3V RIGHT COMPARISON:  Right hip CT obtained yesterday. FINDINGS: Twelve C-arm images of the right hip demonstrate compression screw and plate fixation of the previously demonstrated right intertrochanteric fracture with satisfactory position and alignment. No additional fractures seen. IMPRESSION: Operative fixation of a right intertrochanteric fracture with satisfactory position and alignment. Electronically Signed   By: Beckie Salts M.D.   On: 09/28/2023 18:07   DG C-Arm 1-60 Min-No Report Result Date: 09/28/2023 Fluoroscopy was utilized by the requesting physician.  No radiographic interpretation.   CT 3D RECON AT SCANNER Result Date: 09/27/2023 CLINICAL DATA:  Nonspecific (abnormal) findings on radiological and other examination of musculoskeletal system right hip. EXAM: 3-DIMENSIONAL CT IMAGE RENDERING ON ACQUISITION WORKSTATION TECHNIQUE: 3-dimensional CT images were rendered by post-processing of the original CT data on an acquisition workstation. The 3-dimensional CT images were interpreted and findings  were reported in the accompanying complete CT report for this study COMPARISON:  CT scan right hip same date. FINDINGS: Redemonstrated is a comminuted intertrochanteric right hip fracture including the lesser trochanter and fracture fragmentation extending along the greater trochanter. There is less than 1 cortex width of lateral displacement of the main distal fragment. The fracture is also slightly impacted but otherwise nondisplaced. The pelvic ring and right acetabulum are intact. Mild changes of right hip osteoarthritis are redemonstrated. Osteopenia. IMPRESSION: Comminuted intertrochanteric right hip fracture is redemonstrated as described above. Electronically Signed   By: Almira Bar M.D.   On: 09/27/2023 22:09   CT Hip Right Wo Contrast Result Date: 09/27/2023 CLINICAL DATA:  Right hip pain after fall, right femoral neck fracture on x-ray EXAM: CT OF THE RIGHT HIP WITHOUT CONTRAST TECHNIQUE: Multidetector CT imaging of the right hip was performed according to the standard protocol. Multiplanar CT image reconstructions were also generated. RADIATION DOSE REDUCTION: This exam was performed according to the departmental dose-optimization program which includes automated exposure control, adjustment of the mA and/or kV according to patient size and/or use of iterative reconstruction technique. COMPARISON:  09/27/2023 FINDINGS: Bones/Joint/Cartilage There is a mildly comminuted inter trochanteric right hip fracture, with mild impaction and ventral angulation at the fracture site. The right femoral head articulates normally with the right acetabulum. There are no other acute displaced fractures. Mild right hip osteoarthritis. Ligaments Suboptimally assessed by CT. Muscles and Tendons No gross abnormalities. Soft tissues The soft tissues are grossly unremarkable. Atherosclerosis of the iliac and femoral vessels. Reconstructed images demonstrate no additional findings. IMPRESSION: 1. Comminuted intertrochanteric right hip fracture, with mild impaction and ventral angulation at the fracture site. Electronically Signed   By: Sharlet Salina M.D.   On: 09/27/2023 21:55   DG Hip Unilat With Pelvis 2-3 Views Right Result Date: 09/27/2023 CLINICAL DATA:  Right hip pain after fall EXAM: DG HIP (WITH OR WITHOUT PELVIS) 2-3V RIGHT COMPARISON:  None Available. FINDINGS: Acute nondisplaced right mildly impacted right femoral neck fracture. The femoral head remains seated in the acetabulum. IMPRESSION: Acute nondisplaced right femoral neck fracture. Electronically Signed   By: Minerva Fester M.D.   On: 09/27/2023 20:59   DG Chest 1 View Result Date: 09/27/2023 CLINICAL DATA:  Right hip pain after  falling EXAM: CHEST  1 VIEW COMPARISON:  08/03/2022 FINDINGS: Stable cardiomediastinal silhouette. Aortic atherosclerotic calcification. Hyperinflation and chronic bronchitic changes. No focal consolidation, pleural effusion, or pneumothorax. No displaced rib fractures. IMPRESSION: No active disease. Electronically Signed   By: Minerva Fester M.D.   On: 09/27/2023 20:55    Catarina Hartshorn, DO  [facility]  If 7PM-7AM, please contact night-coverage [email or web address] Password [email or web address] 09/29/2023, 3:46 PM   LOS: 2 days

## 2023-09-29 NOTE — Progress Notes (Signed)
 Subjective: 1 Day Post-Op Procedure(s) (LRB): OPEN REDUCTION INTERNAL FIXATION HIP WITH PLATE AND SCREWS (Right) Patient reports pain as 0 on 0-10 scale.    Objective: Vital signs in last 24 hours: Temp:  [97.4 F (36.3 C)-98.9 F (37.2 C)] 97.4 F (36.3 C) (03/19 0501) Pulse Rate:  [62-94] 87 (03/19 0501) Resp:  [16-22] 16 (03/19 0501) BP: (97-135)/(60-83) 135/70 (03/19 0501) SpO2:  [98 %-100 %] 100 % (03/19 0501) Weight:  [40 kg] 40 kg (03/18 1217)  Intake/Output from previous day: 03/18 0701 - 03/19 0700 In: 2026.9 [I.V.:1826.9; IV Piggyback:200] Out: 650 [Urine:600; Blood:50] Intake/Output this shift: Total I/O In: -  Out: 500 [Urine:500]  Recent Labs    09/27/23 1924 09/28/23 0447 09/29/23 0513  HGB 12.4 11.2* 9.3*   Recent Labs    09/28/23 0447 09/29/23 0513  WBC 9.5 14.4*  RBC 3.07* 2.60*  HCT 33.3* 28.6*  PLT 195 165   Recent Labs    09/28/23 0447 09/29/23 0513  NA 137 135  K 4.1 3.9  CL 98 100  CO2 30 28  BUN 20 16  CREATININE 0.84 0.76  GLUCOSE 108* 124*  CALCIUM 9.2 8.8*   Recent Labs    09/27/23 2008  INR 0.9    Neurologically intact Neurovascular intact Sensation intact distally Intact pulses distally Dorsiflexion/Plantar flexion intact Incision: scant drainage   Assessment/Plan: 1 Day Post-Op Procedure(s) (LRB): OPEN REDUCTION INTERNAL FIXATION HIP WITH PLATE AND SCREWS (Right) Advance diet Up with therapy D/C IV fluids Discharge to SNF when medically appropriate Orthopedic postop plans are in the postoperative note        Fuller Canada 09/29/2023, 6:55 AM

## 2023-09-29 NOTE — Evaluation (Signed)
 Physical Therapy Evaluation Patient Details Name: Maria Stevens MRN: [id number] DOB: 23-Apr-1951 Today's Date: 09/29/2023  History of Present Illness  The accident occurred 12 to 24 hours ago. The fall occurred while standing. She landed on Hard floor. There was no blood loss. The point of impact was the right hip. The pain is present in the right hip. The pain is severe. The symptoms are aggravated by use of injured limb, rotation and movement. Pertinent negatives include no abdominal pain, bowel incontinence, fever, headaches, hearing loss, hematuria, loss of consciousness, nausea, numbness, tingling, visual change or vomiting. OPEN REDUCTION INTERNAL FIXATION HIP WITH PLATE AND SCREWS (Right) -  Clinical Impression  Pt presents resting in bed in left rotated position. Pt demonstrates decreased ability to perform bed mobility, functional transfers and ambulation during session today. Pt requires min to mod assist with bed mobility and functional transfers and CGA for ambulation with RW. Pt is limited by pain, decreased RLE ROM and RLE weakness. Pt also demonstrates signs of increase respiratory rate although maintaining >90 O2 sats on room air following ambulation. Pt would continue to benefit from skilled acute PT for increased LE strength, balance, endurance, and increased gait speed for improved quality of life and return to higher level of function.         If plan is discharge home, recommend the following: A lot of help with walking and/or transfers;A lot of help with bathing/dressing/bathroom;Assistance with cooking/housework;Help with stairs or ramp for entrance;Assist for transportation   Can travel by private vehicle   No    Equipment Recommendations Rolling walker (2 wheels)  Recommendations for Other Services       Functional Status Assessment Patient has had a recent decline in their functional status and demonstrates the ability to make significant improvements in function in a  reasonable and predictable amount of time.     Precautions / Restrictions Precautions Precautions: Fall Recall of Precautions/Restrictions: Intact Restrictions Weight Bearing Restrictions Per Provider Order: Yes RLE Weight Bearing Per Provider Order: Weight bearing as tolerated      Mobility  Bed Mobility Overal bed mobility: Needs Assistance Bed Mobility: Supine to Sit     Supine to sit: Min assist, Mod assist     General bed mobility comments: slow labored movement    Transfers Overall transfer level: Needs assistance Equipment used: Rolling walker (2 wheels) Transfers: Sit to/from Stand, Bed to chair/wheelchair/BSC Sit to Stand: Min assist, Mod assist   Step pivot transfers: Min assist, Mod assist       General transfer comment: slow labored movement; level of assist based on labored effort and extended time.    Ambulation/Gait Ambulation/Gait assistance: Mod assist, Min assist   Assistive device: Rolling walker (2 wheels) Gait Pattern/deviations: Decreased step length - right, Decreased step length - left, Decreased stride length, Antalgic Gait velocity: decreased     General Gait Details: Pt demonstrates decreased stride length and quality due to pain of RLE.  Stairs            Wheelchair Mobility     Tilt Bed    Modified Rankin (Stroke Patients Only)       Balance Overall balance assessment: Needs assistance Sitting-balance support: No upper extremity supported, Feet supported Sitting balance-Leahy Scale: Fair Sitting balance - Comments: fair to good seated at EOB   Standing balance support: Bilateral upper extremity supported, During functional activity Standing balance-Leahy Scale: Poor Standing balance comment: poor to fair using RW  Pertinent Vitals/Pain Pain Assessment Pain Assessment: 0-10 Pain Score: 8  Pain Location: R hip Pain Descriptors / Indicators: Sore Pain Intervention(s):  Monitored during session, Repositioned    Home Living Family/patient expects to be discharged to:: Private residence Living Arrangements: Spouse/significant other Available Help at Discharge: Family;Available 24 hours/day Type of Home: House Home Access: Stairs to enter Entrance Stairs-Rails: Can reach both Entrance Stairs-Number of Steps: 2   Home Layout: Two level;Able to live on main level with bedroom/bathroom Home Equipment: Rolling Walker (2 wheels);Cane - single point;Shower seat;Shower seat - built in Additional Comments: Pt had health issues yesterday. Unsure of status at this time.    Prior Function Prior Level of Function : Needs assist       Physical Assist : ADLs (physical)   ADLs (physical): IADLs Mobility Comments: SPC and RW use PRN for houshold ambulation. ADLs Comments: Independent ADL; assist for IADL's.     Extremity/Trunk Assessment   Upper Extremity Assessment Upper Extremity Assessment: Defer to OT evaluation RUE Deficits / Details: General weakness. Mild A/ROM deficit at end range. LUE Deficits / Details: Previous shoulder injury recently. 3-/5 MMT; Mild P/ROM shoulder flexion limtiation as well.    Lower Extremity Assessment Lower Extremity Assessment: Overall WFL for tasks assessed;Generalized weakness;RLE deficits/detail RLE Deficits / Details: pain, limited ROM    Cervical / Trunk Assessment Cervical / Trunk Assessment: Kyphotic  Communication   Communication Communication: No apparent difficulties    Cognition Arousal: Alert Behavior During Therapy: WFL for tasks assessed/performed   PT - Cognitive impairments: No apparent impairments                         Following commands: Intact       Cueing Cueing Techniques: Verbal cues, Visual cues     General Comments      Exercises     Assessment/Plan    PT Assessment Patient needs continued PT services  PT Problem List Decreased strength;Decreased balance;Decreased  range of motion;Decreased mobility;Decreased activity tolerance;Pain       PT Treatment Interventions DME instruction;Gait training;Stair training;Therapeutic exercise;Therapeutic activities;Neuromuscular re-education;Functional mobility training;Balance training;Patient/family education    PT Goals (Current goals can be found in the Care Plan section)  Acute Rehab PT Goals Patient Stated Goal: to leave hospital PT Goal Formulation: With patient Time For Goal Achievement: 10/13/23 Potential to Achieve Goals: Good    Frequency Min 3X/week     Co-evaluation PT/OT/SLP Co-Evaluation/Treatment: Yes Reason for Co-Treatment: To address functional/ADL transfers PT goals addressed during session: Mobility/safety with mobility;Balance;Proper use of DME;Strengthening/ROM OT goals addressed during session: ADL's and self-care       AM-PAC PT "6 Clicks" Mobility  Outcome Measure Help needed turning from your back to your side while in a flat bed without using bedrails?: A Little Help needed moving from lying on your back to sitting on the side of a flat bed without using bedrails?: A Little Help needed moving to and from a bed to a chair (including a wheelchair)?: A Lot Help needed standing up from a chair using your arms (e.g., wheelchair or bedside chair)?: A Lot Help needed to walk in hospital room?: A Lot Help needed climbing 3-5 steps with a railing? : Total 6 Click Score: 13    End of Session Equipment Utilized During Treatment: Gait belt Activity Tolerance: Patient limited by pain;Patient tolerated treatment well Patient left: in chair;with call bell/phone within reach;with chair alarm set Nurse Communication: Mobility status;Weight bearing status PT  Visit Diagnosis: Unsteadiness on feet (R26.81);Other abnormalities of gait and mobility (R26.89);History of falling (Z91.81);Muscle weakness (generalized) (M62.81);Difficulty in walking, not elsewhere classified (R26.2)    Time:  4403-4742 PT Time Calculation (min) (ACUTE ONLY): 34 min   Charges:   PT Evaluation $PT Eval Low Complexity: 1 Low PT Treatments $Therapeutic Activity: 8-22 mins PT General Charges $$ ACUTE PT VISIT: 1 Visit         Luz Lex, PT, DPT [facility] Office: [phone]

## 2023-09-29 NOTE — TOC Progression Note (Addendum)
 Transition of Care [facility]) - Progression Note    Patient Details  Name: Maria Stevens MRN: [id number] Date of Birth: 1950-07-29  Transition of Care [facility]) CM/SW Contact  Karn Cassis, [place] Phone Number: 09/29/2023, 11:42 AM  Clinical Narrative: LCSW reviewed bed offers with pt's husband who accepts [facility]. Facility notified. CMA also aware to update auth. TOC will follow.     Update: Auth approved- 3/20-3/24, Vesta Mixer id [id number].   Expected Discharge Plan: Skilled Nursing Facility Barriers to Discharge: Continued Medical Work up  Expected Discharge Plan and Services In-house Referral: Clinical Social Work     Living arrangements for the past 2 months: Single Family Home                                       Social Determinants of Health (SDOH) Interventions SDOH Screenings   Food Insecurity: No Food Insecurity (09/27/2023)  Housing: Low Risk  (09/27/2023)  Transportation Needs: No Transportation Needs (09/27/2023)  Utilities: Not At Risk (09/27/2023)  Financial Resource Strain: Low Risk  (11/17/2022)   Received from [facility], [facility]  Physical Activity: Insufficiently Active (02/26/2021)   Received from [facility], [facility]  Social Connections: Socially Integrated (09/27/2023)  Stress: No Stress Concern Present (02/26/2021)   Received from [facility], [facility]  Tobacco Use: Medium Risk (09/28/2023)  Health Literacy: Medium Risk (02/26/2021)   Received from [facility], [facility]    Readmission Risk Interventions     No data to display

## 2023-09-29 NOTE — TOC Progression Note (Signed)
 Transition of Care [facility]) - Progression Note    Patient Details  Name: Maria Stevens MRN: [id number] Date of Birth: 1950/12/10  Transition of Care [facility]) CM/SW Contact  Karn Cassis, [place] Phone Number: 09/29/2023, 10:33 AM  Clinical Narrative:  PT/OT recommending SNF. LCSW discussed with pt's husband and reviewed Medicare.gov ratings. Pt's husband requests [facility] but does not prefer [facility]. Referral sent. Will initiate authorization.      Expected Discharge Plan: Skilled Nursing Facility Barriers to Discharge: Continued Medical Work up  Expected Discharge Plan and Services In-house Referral: Clinical Social Work     Living arrangements for the past 2 months: Single Family Home                                       Social Determinants of Health (SDOH) Interventions SDOH Screenings   Food Insecurity: No Food Insecurity (09/27/2023)  Housing: Low Risk  (09/27/2023)  Transportation Needs: No Transportation Needs (09/27/2023)  Utilities: Not At Risk (09/27/2023)  Financial Resource Strain: Low Risk  (11/17/2022)   Received from [facility], [facility]  Physical Activity: Insufficiently Active (02/26/2021)   Received from [facility], [facility]  Social Connections: Socially Integrated (09/27/2023)  Stress: No Stress Concern Present (02/26/2021)   Received from [facility], [facility]  Tobacco Use: Medium Risk (09/28/2023)  Health Literacy: Medium Risk (02/26/2021)   Received from [facility], [facility]    Readmission Risk Interventions     No data to display

## 2023-09-29 NOTE — Evaluation (Signed)
 Occupational Therapy Evaluation Patient Details Name: Maria Stevens MRN: [id number] DOB: April 13, 1951 Today's Date: 09/29/2023   History of Present Illness   The accident occurred 12 to 24 hours ago. The fall occurred while standing. She landed on Hard floor. There was no blood loss. The point of impact was the right hip. The pain is present in the right hip. The pain is severe. The symptoms are aggravated by use of injured limb, rotation and movement. Pertinent negatives include no abdominal pain, bowel incontinence, fever, headaches, hearing loss, hematuria, loss of consciousness, nausea, numbness, tingling, visual change or vomiting. OPEN REDUCTION INTERNAL FIXATION HIP WITH PLATE AND SCREWS (Right) - (per MD)     Clinical Impressions Pt agreeable to OT and PT co-evaluation. Pt is independent with ADL's at baseline. PRN use of cane and RW at baseline. Min to mod A for bed mobility and EOB to chair transfer today with RW. Slow labored movement with cuing for proper use of RW. General B UE weakness with L UE shoulder injury limiting A/ROM. Mod A for lower body dressing based on difficulty with a sock today. Pt left in the chair with call bell within reach and chair alarm set. Pt will benefit from continued OT in the hospital and recommended venue below to increase strength, balance, and endurance for safe ADL's.        If plan is discharge home, recommend the following:   A lot of help with walking and/or transfers;A lot of help with bathing/dressing/bathroom;Assistance with cooking/housework;Assist for transportation;Help with stairs or ramp for entrance     Functional Status Assessment   Patient has had a recent decline in their functional status and demonstrates the ability to make significant improvements in function in a reasonable and predictable amount of time.     Equipment Recommendations   None recommended by OT             Precautions/Restrictions    Precautions Precautions: Fall Recall of Precautions/Restrictions: Intact Restrictions Weight Bearing Restrictions Per Provider Order: Yes RLE Weight Bearing Per Provider Order: Weight bearing as tolerated     Mobility Bed Mobility Overal bed mobility: Needs Assistance Bed Mobility: Supine to Sit     Supine to sit: Min assist, Mod assist     General bed mobility comments: slow labored movement    Transfers Overall transfer level: Needs assistance Equipment used: Rolling walker (2 wheels) Transfers: Sit to/from Stand, Bed to chair/wheelchair/BSC Sit to Stand: Min assist, Mod assist     Step pivot transfers: Min assist, Mod assist     General transfer comment: slow labored movement; level of assist based on labored effort and extended time.      Balance Overall balance assessment: Needs assistance Sitting-balance support: No upper extremity supported, Feet supported Sitting balance-Leahy Scale: Fair Sitting balance - Comments: fair to good seated at EOB   Standing balance support: Bilateral upper extremity supported, During functional activity Standing balance-Leahy Scale: Poor Standing balance comment: poor to fair using RW                           ADL either performed or assessed with clinical judgement   ADL Overall ADL's : Needs assistance/impaired     Grooming: Set up;Sitting   Upper Body Bathing: Set up;Sitting   Lower Body Bathing: Moderate assistance;Sitting/lateral leans   Upper Body Dressing : Set up;Sitting   Lower Body Dressing: Moderate assistance;Sitting/lateral leans Lower Body Dressing Details (indicate cue  type and reason): Assist to don sock seated at EOB. Toilet Transfer: Moderate assistance;Rolling walker (2 wheels);Minimal assistance Toilet Transfer Details (indicate cue type and reason): Simulated via EOB to chair with RW Toileting- Clothing Manipulation and Hygiene: Minimal assistance;Moderate assistance;Sitting/lateral  lean       Functional mobility during ADLs: Minimal assistance;Moderate assistance;Rolling walker (2 wheels) General ADL Comments: Able to take a few steps forward and backward with RW.     Vision Baseline Vision/History: 1 Wears glasses (Pt reported a "eye disease" and worsening vision. No acute changes.) Ability to See in Adequate Light: 1 Impaired Patient Visual Report: No change from baseline Vision Assessment?: No apparent visual deficits Additional Comments: baseline deficits     Perception Perception: Not tested       Praxis Praxis: Not tested       Pertinent Vitals/Pain Pain Assessment Pain Assessment: 0-10 Pain Score: 8  Pain Location: R hip Pain Descriptors / Indicators: Sore Pain Intervention(s): Monitored during session, Repositioned     Extremity/Trunk Assessment Upper Extremity Assessment Upper Extremity Assessment: RUE deficits/detail;LUE deficits/detail;Right hand dominant RUE Deficits / Details: General weakness. Mild A/ROM deficit at end range. LUE Deficits / Details: Previous shoulder injury recently. 3-/5 MMT; Mild P/ROM shoulder flexion limtiation as well.           Communication Communication Communication: No apparent difficulties   Cognition Arousal: Alert Behavior During Therapy: WFL for tasks assessed/performed Cognition: No apparent impairments                               Following commands: Intact       Cueing  General Comments   Cueing Techniques: Verbal cues                 Home Living Family/patient expects to be discharged to:: Private residence Living Arrangements: Spouse/significant other Available Help at Discharge: Family;Available 24 hours/day Type of Home: House Home Access: Stairs to enter [employer] of Steps: 2 Entrance Stairs-Rails: Can reach both Home Layout: Two level;Able to live on main level with bedroom/bathroom     Bathroom Shower/Tub: Higher education careers adviser: Standard Bathroom Accessibility: Yes How Accessible: Accessible via wheelchair;Accessible via walker Home Equipment: Rolling Walker (2 wheels);Cane - single point;Shower seat;Shower seat - built in   Additional Comments: Pt had health issues yesterday. Unsure of status at this time.      Prior Functioning/Environment Prior Level of Function : Needs assist       Physical Assist : ADLs (physical)   ADLs (physical): IADLs Mobility Comments: SPC and RW use PRN for houshold ambulation. ADLs Comments: Independent ADL; assist for IADL's.    OT Problem List: Decreased strength;Decreased range of motion;Decreased activity tolerance;Impaired balance (sitting and/or standing)   OT Treatment/Interventions: Self-care/ADL training;Therapeutic exercise;DME and/or AE instruction;Therapeutic activities;Patient/family education;Balance training      OT Goals(Current goals can be found in the care plan section)   Acute Rehab OT Goals Patient Stated Goal: return home OT Goal Formulation: With patient Time For Goal Achievement: 10/13/23 Potential to Achieve Goals: Good   OT Frequency:  Min 2X/week    Co-evaluation PT/OT/SLP Co-Evaluation/Treatment: Yes Reason for Co-Treatment: To address functional/ADL transfers   OT goals addressed during session: ADL's and self-care                       End of Session Equipment Utilized During Treatment: Rolling walker (2 wheels);Gait belt  Nurse Communication: Mobility status  Activity Tolerance: Patient tolerated treatment well Patient left: in chair;with call bell/phone within reach;with chair alarm set  OT Visit Diagnosis: Unsteadiness on feet (R26.81);Other abnormalities of gait and mobility (R26.89);Muscle weakness (generalized) (M62.81);History of falling (Z91.81)                Time: 0821-0901 OT Time Calculation (min): 40 min Charges:  OT General Charges $OT Visit: 1 Visit OT Evaluation $OT Eval Low Complexity: 1  Low  Modestine Scherzinger OT, MOT  Danie Chandler 09/29/2023, 9:49 AM

## 2023-09-29 NOTE — Progress Notes (Signed)
 Mobility Specialist Progress Note:    09/29/23 1115  Mobility  Activity Transferred from chair to bed  Level of Assistance Moderate assist, patient does 50-74%  Assistive Device None  Distance Ambulated (ft) 5 ft  Range of Motion/Exercises Active;All extremities  RLE Weight Bearing Per Provider Order WBAT  Activity Response Tolerated well  Mobility Referral Yes  Mobility visit 1 Mobility  Mobility Specialist Start Time (ACUTE ONLY) 1050  Mobility Specialist Stop Time (ACUTE ONLY) 1105  Mobility Specialist Time Calculation (min) (ACUTE ONLY) 15 min   Pt received in chair, requesting assistance to bed.Required ModA to stand and transfer with no AD. Tolerated well, RLE WBAT. Left pt supine, NT at bedside. All needs met.   Lawerance Bach Mobility Specialist Please contact via Special educational needs teacher or  Rehab office at [phone]

## 2023-09-29 NOTE — Plan of Care (Signed)
  Problem: Acute Rehab PT Goals(only PT should resolve) Goal: Pt Will Go Supine/Side To Sit Outcome: Progressing Flowsheets (Taken 09/29/2023 1034) Pt will go Supine/Side to Sit:  with modified independence  with supervision Goal: Patient Will Transfer Sit To/From Stand Outcome: Progressing Flowsheets (Taken 09/29/2023 1034) Patient will transfer sit to/from stand:  with modified independence  with supervision Goal: Pt Will Transfer Bed To Chair/Chair To Bed Outcome: Progressing Flowsheets (Taken 09/29/2023 1034) Pt will Transfer Bed to Chair/Chair to Bed:  with modified independence  with supervision Goal: Pt Will Ambulate Outcome: Progressing Flowsheets (Taken 09/29/2023 1034) Pt will Ambulate:  100 feet  with modified independence  with contact guard assist  Luz Lex, PT, DPT [facility] Office: [phone]

## 2023-09-29 NOTE — [place] FL2 (Signed)
 [place] MEDICAID FL2 LEVEL OF CARE FORM     IDENTIFICATION  Patient Name: Maria Stevens Birthdate: 08-25-1950 Sex: female Admission Date (Current Location): 09/27/2023  [facility] and [place] Number:  [employer] and Address:  [facility],  [address] [place] [address]      Provider Number: [id number]  Attending Physician Name and Address:  Catarina Hartshorn, MD  Relative Name and Phone Number:       Current Level of Care: Hospital Recommended Level of Care: Skilled Nursing Facility Prior Approval Number:    Date Approved/Denied:   PASRR Number: [id number]  Discharge Plan: SNF    Current Diagnoses: Patient Active Problem List   Diagnosis Date Noted   Anxiety and depression 09/28/2023   Dyslipidemia 09/28/2023   GERD without esophagitis 09/28/2023   Closed nondisplaced intertrochanteric fracture of right femur (HCC) 09/27/2023   Colonic mass 06/12/2021   Abnormal CT of the chest 02/20/2021   COPD (chronic obstructive pulmonary disease) (HCC) 02/20/2021   Chronic hypoxemic respiratory failure (HCC) 02/20/2021   Ruptured right breast implant, initial encounter 10/18/2018   Rupture of implant of left breast 10/18/2018    Orientation RESPIRATION BLADDER Height & Weight     Self, Situation, Place  O2 (3L) External catheter Weight: 88 lb 2.9 oz (40 kg) Height:  5\' 1"  (154.9 cm)  BEHAVIORAL SYMPTOMS/MOOD NEUROLOGICAL BOWEL NUTRITION STATUS      Incontinent Diet (See d/c summary)  AMBULATORY STATUS COMMUNICATION OF NEEDS Skin   Extensive Assist Verbally Surgical wounds, Skin abrasions, Bruising                       Personal Care Assistance Level of Assistance  Bathing, Feeding, Dressing Bathing Assistance: Maximum assistance Feeding assistance: Limited assistance Dressing Assistance: Maximum assistance     Functional Limitations Info  Sight, Hearing, Speech Sight Info: Adequate Hearing Info: Adequate Speech Info: Adequate     SPECIAL CARE FACTORS FREQUENCY  PT (By licensed PT)     PT Frequency: 5x weekly              Contractures      Additional Factors Info  Code Status, Allergies, Psychotropic Code Status Info: DNR- Limited Allergies Info: Carisoprodol  Homatropine  Hydrocodone  Sulfacetamide  Sulfamethoxazole  Trimethoprim  Aspirin Psychotropic Info: Xanax         Current Medications (09/29/2023):  This is the current hospital active medication list Current Facility-Administered Medications  Medication Dose Route Frequency Provider Last Rate Last Admin   acetaminophen (TYLENOL) tablet 650 mg  650 mg Oral Q6H Vickki Hearing, MD   650 mg at 09/28/23 1720   ALPRAZolam Prudy Feeler) tablet 1 mg  1 mg Oral TID Vickki Hearing, MD   1 mg at 09/29/23 1015   chlorhexidine (PERIDEX) 0.12 % solution 15 mL  15 mL Mouth/Throat QID Johnson, Clanford L, MD   15 mL at 09/29/23 1015   Chlorhexidine Gluconate Cloth 2 % PADS 6 each  6 each Topical Daily Johnson, Clanford L, MD       docusate sodium (COLACE) capsule 100 mg  100 mg Oral BID Vickki Hearing, MD   100 mg at 09/29/23 1014   enoxaparin (LOVENOX) injection 30 mg  30 mg Subcutaneous Q24H Vickki Hearing, MD   30 mg at 09/29/23 1015   famotidine (PEPCID) tablet 40 mg  40 mg Oral Daily Vickki Hearing, MD   40 mg at 09/29/23 1014  feeding supplement (ENSURE ENLIVE / ENSURE PLUS) liquid 237 mL  237 mL Oral TID BM Vickki Hearing, MD       FLUoxetine (PROZAC) capsule 40 mg  40 mg Oral Daily Vickki Hearing, MD   40 mg at 09/29/23 1015   ipratropium-albuterol (DUONEB) 0.5-2.5 (3) MG/3ML nebulizer solution 3 mL  3 mL Nebulization Q4H PRN Vickki Hearing, MD       ipratropium-albuterol (DUONEB) 0.5-2.5 (3) MG/3ML nebulizer solution 3 mL  3 mL Nebulization BID Johnson, Clanford L, MD   3 mL at 09/29/23 0810   magnesium hydroxide (MILK OF MAGNESIA) suspension 30 mL  30 mL Oral Daily PRN Vickki Hearing, MD        menthol-cetylpyridinium (CEPACOL) lozenge 3 mg  1 lozenge Oral PRN Vickki Hearing, MD       Or   phenol (CHLORASEPTIC) mouth spray 1 spray  1 spray Mouth/Throat PRN Vickki Hearing, MD       methocarbamol (ROBAXIN) tablet 500 mg  500 mg Oral Q6H PRN Vickki Hearing, MD       Or   methocarbamol (ROBAXIN) injection 500 mg  500 mg Intravenous Q6H PRN Vickki Hearing, MD       metoCLOPramide (REGLAN) tablet 5-10 mg  5-10 mg Oral Q8H PRN Vickki Hearing, MD       Or   metoCLOPramide (REGLAN) injection 5-10 mg  5-10 mg Intravenous Q8H PRN Vickki Hearing, MD       multivitamin with minerals tablet 1 tablet  1 tablet Oral Daily Vickki Hearing, MD   1 tablet at 09/29/23 1014   ondansetron (ZOFRAN) injection 4 mg  4 mg Intravenous Q6H PRN Vickki Hearing, MD       [START ON 09/30/2023] rosuvastatin (CRESTOR) tablet 20 mg  20 mg Oral Once per day on Monday Thursday Vickki Hearing, MD       Vitamin D (Ergocalciferol) (DRISDOL) 1.25 MG (50000 UNIT) capsule 50,000 Units  50,000 Units Oral Weekly Vickki Hearing, MD         Discharge Medications: Please see discharge summary for a list of discharge medications.  Relevant Imaging Results:  Relevant Lab Results:   Additional Information SSN: [id number]  Karn Cassis, LCSW

## 2023-09-29 NOTE — Progress Notes (Signed)
 Nurse at bedside,patient alert to self,and situation,confused to time,and place. Patient does have tremors.Heart rate 108,blood pressure 105/69,Dr Tat notified. 16 french foley discontinued this am per MD' orders,10 ml's of saline returned to syringe,patient tolerated removal of foley without difficulty. Patient out of bed to chair at this time. Plan of care on going.

## 2023-09-29 NOTE — Progress Notes (Signed)
 Patient has not voided since this am since foley was discontinued ,bladder scan showed 104 ml's of urine.Dr Tat notified..Plan of care on going.

## 2023-09-29 NOTE — Plan of Care (Signed)
  Problem: Acute Rehab OT Goals (only OT should resolve) Goal: Pt. Will Perform Grooming Flowsheets (Taken 09/29/2023 0953) Pt Will Perform Grooming: with modified independence Goal: Pt. Will Perform Upper Body Dressing Flowsheets (Taken 09/29/2023 0953) Pt Will Perform Upper Body Dressing:  with modified independence  sitting Goal: Pt. Will Perform Lower Body Dressing Flowsheets (Taken 09/29/2023 0953) Pt Will Perform Lower Body Dressing:  with modified independence  sitting/lateral leans Goal: Pt. Will Transfer To Toilet Flowsheets (Taken 09/29/2023 [phone]) Pt Will Transfer to Toilet:  with modified independence  stand pivot transfer  ambulating Goal: Pt. Will Perform Toileting-Clothing Manipulation Flowsheets (Taken 09/29/2023 0953) Pt Will Perform Toileting - Clothing Manipulation and hygiene: with modified independence Goal: Pt/Caregiver Will Perform Home Exercise Program Flowsheets (Taken 09/29/2023 [phone]) Pt/caregiver will Perform Home Exercise Program:  Increased strength  Increased ROM  Left upper extremity  Right Upper extremity  Independently  Emmamarie Kluender OT, MOT

## 2023-09-30 DIAGNOSIS — F419 Anxiety disorder, unspecified: Secondary | ICD-10-CM | POA: Diagnosis not present

## 2023-09-30 DIAGNOSIS — I471 Supraventricular tachycardia, unspecified: Secondary | ICD-10-CM | POA: Diagnosis not present

## 2023-09-30 DIAGNOSIS — S72144K Nondisplaced intertrochanteric fracture of right femur, subsequent encounter for closed fracture with nonunion: Secondary | ICD-10-CM | POA: Diagnosis not present

## 2023-09-30 DIAGNOSIS — F32A Depression, unspecified: Secondary | ICD-10-CM | POA: Diagnosis not present

## 2023-09-30 LAB — IRON AND TIBC
Iron: 11 ug/dL — ABNORMAL LOW (ref 28–170)
Saturation Ratios: 7 % — ABNORMAL LOW (ref 10.4–31.8)
TIBC: 159 ug/dL — ABNORMAL LOW (ref 250–450)
UIBC: 148 ug/dL

## 2023-09-30 LAB — FERRITIN: Ferritin: 297 ng/mL (ref 11–307)

## 2023-09-30 LAB — CBC
HCT: 26.6 % — ABNORMAL LOW (ref 36.0–46.0)
Hemoglobin: 8.5 g/dL — ABNORMAL LOW (ref 12.0–15.0)
MCH: 35.3 pg — ABNORMAL HIGH (ref 26.0–34.0)
MCHC: 32 g/dL (ref 30.0–36.0)
MCV: 110.4 fL — ABNORMAL HIGH (ref 80.0–100.0)
Platelets: 163 10*3/uL (ref 150–400)
RBC: 2.41 MIL/uL — ABNORMAL LOW (ref 3.87–5.11)
RDW: 12.1 % (ref 11.5–15.5)
WBC: 11.2 10*3/uL — ABNORMAL HIGH (ref 4.0–10.5)
nRBC: 0 % (ref 0.0–0.2)

## 2023-09-30 LAB — FOLATE: Folate: 4.9 ng/mL — ABNORMAL LOW (ref >5.9)

## 2023-09-30 LAB — T4, FREE: Free T4: 0.94 ng/dL (ref 0.61–1.12)

## 2023-09-30 LAB — VITAMIN B12: Vitamin B-12: 392 pg/mL (ref 180–914)

## 2023-09-30 LAB — TSH: TSH: 1.212 u[IU]/mL (ref 0.350–4.500)

## 2023-09-30 MED ORDER — FERROUS SULFATE 325 (65 FE) MG PO TABS
325.0000 mg | ORAL_TABLET | Freq: Every day | ORAL | Status: DC
  Administered 2023-10-01: 325 mg via ORAL
  Filled 2023-09-30: qty 1

## 2023-09-30 MED ORDER — METOPROLOL TARTRATE 25 MG PO TABS
25.0000 mg | ORAL_TABLET | Freq: Two times a day (BID) | ORAL | Status: DC
  Administered 2023-09-30 (×2): 25 mg via ORAL
  Filled 2023-09-30 (×3): qty 1

## 2023-09-30 MED ORDER — SODIUM CHLORIDE 0.9 % IV BOLUS
500.0000 mL | Freq: Once | INTRAVENOUS | Status: AC
  Administered 2023-09-30: 500 mL via INTRAVENOUS

## 2023-09-30 MED ORDER — FOLIC ACID 1 MG PO TABS
1.0000 mg | ORAL_TABLET | Freq: Every day | ORAL | Status: DC
Start: 2023-09-30 — End: 2023-10-02
  Administered 2023-09-30 – 2023-10-02 (×3): 1 mg via ORAL
  Filled 2023-09-30 (×3): qty 1

## 2023-09-30 NOTE — Progress Notes (Signed)
 Subjective: 2 Days Post-Op Procedure(s) (LRB): OPEN REDUCTION INTERNAL FIXATION HIP WITH PLATE AND SCREWS (Right) Patient reports pain as mild.    Objective: Vital signs in last 24 hours: Temp:  [97.8 F (36.6 C)-98.4 F (36.9 C)] 98.4 F (36.9 C) (03/20 0512) Pulse Rate:  [83-108] 96 (03/20 0512) Resp:  [18-19] 18 (03/20 0512) BP: (93-127)/(51-69) 127/62 (03/20 0512) SpO2:  [94 %-100 %] 100 % (03/20 0512)  Intake/Output from previous day: 03/19 0701 - 03/20 0700 In: 440 [P.O.:440] Out: 600 [Urine:600] Intake/Output this shift: No intake/output data recorded.  Recent Labs    09/27/23 1924 09/28/23 0447 09/29/23 0513 09/30/23 0500  HGB 12.4 11.2* 9.3* 8.5*   Recent Labs    09/29/23 0513 09/30/23 0500  WBC 14.4* 11.2*  RBC 2.60* 2.41*  HCT 28.6* 26.6*  PLT 165 163   Recent Labs    09/28/23 0447 09/29/23 0513  NA 137 135  K 4.1 3.9  CL 98 100  CO2 30 28  BUN 20 16  CREATININE 0.84 0.76  GLUCOSE 108* 124*  CALCIUM 9.2 8.8*   Recent Labs    09/27/23 2008  INR 0.9    Neurologically intact Neurovascular intact Sensation intact distally Intact pulses distally Dorsiflexion/Plantar flexion intact Incision: scant drainage   Assessment/Plan: 2 Days Post-Op Procedure(s) (LRB): OPEN REDUCTION INTERNAL FIXATION HIP WITH PLATE AND SCREWS (Right) Up with therapy Discharge to SNF when bed available and medical status stable.  Orthopedic follow-up instructions are in the operative note    Fuller Canada 09/30/2023, 8:40 AM

## 2023-09-30 NOTE — Progress Notes (Signed)
 Physical Therapy Treatment Patient Details Name: Maria Stevens MRN: [id number] DOB: 07/22/1950 Today's Date: 09/30/2023   History of Present Illness The accident occurred 12 to 24 hours ago. The fall occurred while standing. She landed on Hard floor. There was no blood loss. The point of impact was the right hip. The pain is present in the right hip. The pain is severe. The symptoms are aggravated by use of injured limb, rotation and movement. Pertinent negatives include no abdominal pain, bowel incontinence, fever, headaches, hearing loss, hematuria, loss of consciousness, nausea, numbness, tingling, visual change or vomiting. OPEN REDUCTION INTERNAL FIXATION HIP WITH PLATE AND SCREWS (Right) -    PT Comments  Pt continues to demonstrate decreased independence with functional transfers and ambulation. Pt does demonstrate some improved independence with bed mobility but does continue to need assistance.  RLE weightbearing continues to be the main limitation from obtaining further ambulation. Bed to chair transfer completed with only gait belt and mod assist at first due to pt stating she got up last night by herself but pt still demonstrating need for AD at this time. Pt would continue to benefit from skilled PT for increased RLE ROM, strength, and endurance for improved gait pattern, improved independence, and return to higher level of function.   If plan is discharge home, recommend the following: A lot of help with walking and/or transfers;A lot of help with bathing/dressing/bathroom;Assistance with cooking/housework;Help with stairs or ramp for entrance;Assist for transportation   Can travel by private vehicle     No  Equipment Recommendations  Rolling walker (2 wheels)    Recommendations for Other Services       Precautions / Restrictions Precautions Precautions: Fall Recall of Precautions/Restrictions: Intact Restrictions Weight Bearing Restrictions Per Provider Order: Yes RLE  Weight Bearing Per Provider Order: Weight bearing as tolerated     Mobility  Bed Mobility Overal bed mobility: Needs Assistance Bed Mobility: Supine to Sit     Supine to sit: Min assist, Mod assist     General bed mobility comments: slow labored movement    Transfers Overall transfer level: Needs assistance Equipment used: Rolling walker (2 wheels) Transfers: Sit to/from Stand, Bed to chair/wheelchair/BSC Sit to Stand: Min assist, Mod assist   Step pivot transfers: Min assist, Mod assist       General transfer comment: slow labored movement; level of assist based on labored effort and extended time.    Ambulation/Gait Ambulation/Gait assistance: Mod assist, Min assist Gait Distance (Feet): 10 Feet Assistive device: Rolling walker (2 wheels) Gait Pattern/deviations: Decreased step length - right, Decreased step length - left, Decreased stride length, Antalgic Gait velocity: decreased     General Gait Details: Pt demonstrates decreased stride length and quality due to pain of RLE.   Stairs             Wheelchair Mobility     Tilt Bed    Modified Rankin (Stroke Patients Only)       Balance Overall balance assessment: Needs assistance Sitting-balance support: No upper extremity supported, Feet supported Sitting balance-Leahy Scale: Fair Sitting balance - Comments: fair to good seated at EOB   Standing balance support: Bilateral upper extremity supported, During functional activity Standing balance-Leahy Scale: Poor Standing balance comment: poor to fair using RW                            Communication Communication Communication: No apparent difficulties  Cognition Arousal: Alert Behavior  During Therapy: WFL for tasks assessed/performed   PT - Cognitive impairments: No apparent impairments                         Following commands: Intact      Cueing Cueing Techniques: Verbal cues, Visual cues  Exercises       General Comments        Pertinent Vitals/Pain Pain Assessment Pain Assessment: 0-10 Pain Score: 9  Pain Location: headache Pain Descriptors / Indicators: Aching Pain Intervention(s): Limited activity within patient's tolerance, Repositioned    Home Living                          Prior Function            PT Goals (current goals can now be found in the care plan section) Acute Rehab PT Goals Patient Stated Goal: to leave hospital PT Goal Formulation: With patient Time For Goal Achievement: 10/13/23 Potential to Achieve Goals: Good Progress towards PT goals: Progressing toward goals    Frequency    Min 3X/week      PT Plan      Co-evaluation     PT goals addressed during session: Mobility/safety with mobility;Balance;Proper use of DME;Strengthening/ROM        AM-PAC PT "6 Clicks" Mobility   Outcome Measure  Help needed turning from your back to your side while in a flat bed without using bedrails?: A Little Help needed moving from lying on your back to sitting on the side of a flat bed without using bedrails?: A Little Help needed moving to and from a bed to a chair (including a wheelchair)?: A Lot Help needed standing up from a chair using your arms (e.g., wheelchair or bedside chair)?: A Lot Help needed to walk in hospital room?: A Lot Help needed climbing 3-5 steps with a railing? : Total 6 Click Score: 13    End of Session Equipment Utilized During Treatment: Gait belt Activity Tolerance: Patient limited by pain;Patient tolerated treatment well Patient left: in chair;with call bell/phone within reach;with chair alarm set   PT Visit Diagnosis: Unsteadiness on feet (R26.81);Other abnormalities of gait and mobility (R26.89);History of falling (Z91.81);Muscle weakness (generalized) (M62.81);Difficulty in walking, not elsewhere classified (R26.2)     Time: 8119-1478 PT Time Calculation (min) (ACUTE ONLY): 27 min  Charges:    $Therapeutic  Activity: 23-37 mins PT General Charges $$ ACUTE PT VISIT: 1 Visit                      Luz Lex, PT, DPT [facility] Office: [phone] 3:39 PM, 09/30/23

## 2023-09-30 NOTE — TOC Progression Note (Signed)
 Transition of Care [facility]) - Progression Note    Patient Details  Name: Maria Stevens MRN: [id number] Date of Birth: 06/11/1951  [facility] CM/SW Contact  Elliot Gault, LCSW Phone Number: 09/30/2023, 11:47 AM  Clinical Narrative:     TOC following. MD anticipating dc tomorrow. Updated Jill Side at [facility] and they can admit pt tomorrow.   Will follow up in AM.  Expected Discharge Plan: Skilled Nursing Facility Barriers to Discharge: Continued Medical Work up  Expected Discharge Plan and Services In-house Referral: Clinical Social Work     Living arrangements for the past 2 months: Single Family Home                                       Social Determinants of Health (SDOH) Interventions SDOH Screenings   Food Insecurity: No Food Insecurity (09/27/2023)  Housing: Low Risk  (09/27/2023)  Transportation Needs: No Transportation Needs (09/27/2023)  Utilities: Not At Risk (09/27/2023)  Financial Resource Strain: Low Risk  (11/17/2022)   [facility] from [facility]  Physical Activity: Insufficiently Active (02/26/2021)   Received from [facility], [facility]  Social Connections: Socially Integrated (09/27/2023)  Stress: No Stress Concern Present (02/26/2021)   Received from [facility], [facility]  Tobacco Use: Medium Risk (09/28/2023)  Health Literacy: Medium Risk (02/26/2021)   Received from [facility], [facility]    Readmission Risk Interventions     No data to display

## 2023-09-30 NOTE — Plan of Care (Signed)
  Problem: Clinical Measurements: Goal: Ability to maintain clinical measurements within normal limits will improve Outcome: Progressing Goal: Cardiovascular complication will be avoided Outcome: Progressing   Problem: Activity: Goal: Risk for activity intolerance will decrease Outcome: Progressing   

## 2023-09-30 NOTE — Progress Notes (Signed)
 Nurse at bedside this am,patient alert to self,time and situation,confused to place.Patient did c/o pain to right hip rated 8,sharp,and constant pain.Plan of care on going.

## 2023-09-30 NOTE — Progress Notes (Signed)
 PROGRESS NOTE  Maria Stevens XLK:[id number] DOB: 06-14-1951 DOA: 09/27/2023 PCP: Ignatius Specking, MD  Brief History:  73 y.o. 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  She denies any presyncope or syncope.  No chest pain or palpitations.  No paresthesias or focal muscle weakness.  No dysuria, oliguria or hematuria or flank pain.  She is on home O2 at all times.  Pt was admitted with acute right hip fracture with plan to go to OR on 09/28/23 with Dr. Romeo Apple.  Patient underwent ORIF on 09/28/2023.  PT evaluation recommended skilled nursing facility with which the patient's family agreed.  The patient continued to be dependent on 3 L nasal cannula which is her home demand.  The patient was started on bronchodilators for her COPD.  She has continued to smoke at home.  The patient continued to drink alcohol although it was unclear when her last drink was.  There was no signs of alcohol withdrawal.   Assessment/Plan: Closed intertrochanteric fracture of the right femur -Status post ORIF 09/28/2023 -PT evaluation>> skilled nursing facility -Judicious opioids -continue DVT prophylaxis   Chronic respiratory failure with hypoxia -Patient is chronically on 3 L nasal cannula at home -Stable presently   SVT/Atrial tachycardia -start low dose metoprolol -TSH 1.212   Alcohol dependence/abuse -Start thiamine -No signs of withdrawal   COPD -Continue DuoNebs -Started Pulmicort   Tobacco abuse -Tobacco cessation discussed   Anxiety/depression -Continue home dose alprazolam -PDMP reviewed--alprazolam 1 mg, #90, last refill 09/14/2023 -Continue fluoxetine   GERD -Continue famotidine   Mixed hyperlipidemia -Continue statin    Cognitive impairment -Daughter endorses cognitive impairment at baseline                 Family Communication:   daughter at bedside 3/20   Consultants:  ortho   Code Status:  DNR   DVT Prophylaxis:  [place] Lovenox     Procedures: As Listed in Progress Note Above   Antibiotics: None        Subjective: Pt complains of pain in right leg.  Denies f/c, cp, sob, n/v/d  Objective: Vitals:   09/29/23 1517 09/29/23 2103 09/30/23 0512 09/30/23 0936  BP: 98/67 107/64 127/62 102/60  Pulse: 91 83 96 99  Resp:  19 18   Temp: 98.2 F (36.8 C) 97.8 F (36.6 C) 98.4 F (36.9 C) 98.4 F (36.9 C)  TempSrc: Oral Oral  Oral  SpO2: 100% 94% 100% 99%  Weight:      Height:        Intake/Output Summary (Last 24 hours) at 09/30/2023 1123 Last data filed at 09/30/2023 0830 Gross per 24 hour  Intake 540 ml  Output 600 ml  Net -60 ml   Weight change:  Exam:  General:  Pt is alert, follows commands appropriately, not in acute distress HEENT: No icterus, No thrush, No neck mass, [place]/AT Cardiovascular: RRR, S1/S2, no rubs, no gallops Respiratory: diminished BS.  Bibasilar rales.  No wheeze Abdomen: Soft/+BS, non tender, non distended, no guarding Extremities: No edema, No lymphangitis, No petechiae, No rashes, no synovitis   Data Reviewed: I have personally reviewed following labs and imaging studies Basic Metabolic Panel: Recent Labs  Lab 09/27/23 1924 09/28/23  0447 09/29/23 0513  NA 138 137 135  K 4.7 4.1 3.9  CL 95* 98 100  CO2 28 30 28   GLUCOSE 107* 108* 124*  BUN 22 20 16   CREATININE 0.96 0.84 0.76  CALCIUM 10.1 9.2 8.8*   Liver Function Tests: No results for input(s): "AST", "ALT", "ALKPHOS", "BILITOT", "PROT", "ALBUMIN" in the last 168 hours. No results for input(s): "LIPASE", "AMYLASE" in the last 168 hours. No results for input(s): "AMMONIA" in the last 168 hours. Coagulation Profile: Recent Labs  Lab 09/27/23 2008  INR 0.9   CBC: Recent Labs   Lab 09/27/23 1924 09/28/23 0447 09/29/23 0513 09/30/23 0500  WBC 12.2* 9.5 14.4* 11.2*  NEUTROABS 9.7*  --   --   --   HGB 12.4 11.2* 9.3* 8.5*  HCT 37.9 33.3* 28.6* 26.6*  MCV 108.6* 108.5* 110.0* 110.4*  PLT 211 195 165 163   Cardiac Enzymes: No results for input(s): "CKTOTAL", "CKMB", "CKMBINDEX", "TROPONINI" in the last 168 hours. BNP: Invalid input(s): "POCBNP" CBG: No results for input(s): "GLUCAP" in the last 168 hours. HbA1C: No results for input(s): "HGBA1C" in the last 72 hours. Urine analysis: No results found for: "COLORURINE", "APPEARANCEUR", "LABSPEC", "PHURINE", "GLUCOSEU", "HGBUR", "BILIRUBINUR", "KETONESUR", "PROTEINUR", "UROBILINOGEN", "NITRITE", "LEUKOCYTESUR" Sepsis Labs: @LABRCNTIP (procalcitonin:4,lacticidven:4) ) Recent Results (from the past 240 hours)  Surgical pcr screen     Status: None   Collection Time: 09/28/23 10:50 AM   Specimen: Nasal Mucosa; Nasal Swab  Result Value Ref Range Status   MRSA, PCR NEGATIVE NEGATIVE Final   Staphylococcus aureus NEGATIVE NEGATIVE Final    Comment: (NOTE) The Xpert SA Assay (FDA approved for NASAL specimens in patients 18 years of age and older), is one component of a comprehensive surveillance program. It is not intended to diagnose infection nor to guide or monitor treatment. Performed at [facility], [address] [place], [place] [address]      Scheduled Meds:  acetaminophen  650 mg Oral Q6H   ALPRAZolam  1 mg Oral TID   arformoterol  15 mcg Nebulization BID   budesonide (PULMICORT) nebulizer solution  0.5 mg Nebulization BID   chlorhexidine  15 mL Mouth/Throat QID   Chlorhexidine Gluconate Cloth  6 each Topical Daily   docusate sodium  100 mg Oral BID   enoxaparin (LOVENOX) injection  30 mg Subcutaneous Q24H   famotidine  40 mg Oral Daily   feeding supplement  237 mL Oral TID BM   FLUoxetine  40 mg Oral Daily   ipratropium-albuterol  3 mL Nebulization BID   metoprolol tartrate  25 mg Oral BID    multivitamin with minerals  1 tablet Oral Daily   rosuvastatin  20 mg Oral Once per day on Monday Thursday   thiamine  100 mg Oral Daily   Vitamin D (Ergocalciferol)  50,000 Units Oral Weekly   Continuous Infusions:  sodium chloride      Procedures/Studies: DG HIP UNILAT WITH PELVIS 2-3 VIEWS RIGHT Result Date: 09/28/2023 CLINICAL DATA:  Operative fixation of an intertrochanteric right hip fracture. EXAM: DG HIP (WITH OR WITHOUT PELVIS) 2-3V RIGHT COMPARISON:  Right hip CT obtained yesterday. FINDINGS: Twelve C-arm images of the right hip demonstrate compression screw and plate fixation of the previously demonstrated right intertrochanteric fracture with satisfactory position and alignment. No additional fractures seen. IMPRESSION: Operative fixation of a right intertrochanteric fracture with satisfactory position and alignment. Electronically Signed   By: Beckie Salts M.D.   On: 09/28/2023 18:07   DG C-Arm 1-60 Min-No  Report Result Date: 09/28/2023 Fluoroscopy was utilized by the requesting physician.  No radiographic interpretation.   CT 3D RECON AT SCANNER Result Date: 09/27/2023 CLINICAL DATA:  Nonspecific (abnormal) findings on radiological and other examination of musculoskeletal system right hip. EXAM: 3-DIMENSIONAL CT IMAGE RENDERING ON ACQUISITION WORKSTATION TECHNIQUE: 3-dimensional CT images were rendered by post-processing of the original CT data on an acquisition workstation. The 3-dimensional CT images were interpreted and findings were reported in the accompanying complete CT report for this study COMPARISON:  CT scan right hip same date. FINDINGS: Redemonstrated is a comminuted intertrochanteric right hip fracture including the lesser trochanter and fracture fragmentation extending along the greater trochanter. There is less than 1 cortex width of lateral displacement of the main distal fragment. The fracture is also slightly impacted but otherwise nondisplaced. The pelvic ring  and right acetabulum are intact. Mild changes of right hip osteoarthritis are redemonstrated. Osteopenia. IMPRESSION: Comminuted intertrochanteric right hip fracture is redemonstrated as described above. Electronically Signed   By: Almira Bar M.D.   On: 09/27/2023 22:09   CT Hip Right Wo Contrast Result Date: 09/27/2023 CLINICAL DATA:  Right hip pain after fall, right femoral neck fracture on x-ray EXAM: CT OF THE RIGHT HIP WITHOUT CONTRAST TECHNIQUE: Multidetector CT imaging of the right hip was performed according to the standard protocol. Multiplanar CT image reconstructions were also generated. RADIATION DOSE REDUCTION: This exam was performed according to the departmental dose-optimization program which includes automated exposure control, adjustment of the mA and/or kV according to patient size and/or use of iterative reconstruction technique. COMPARISON:  09/27/2023 FINDINGS: Bones/Joint/Cartilage There is a mildly comminuted inter trochanteric right hip fracture, with mild impaction and ventral angulation at the fracture site. The right femoral head articulates normally with the right acetabulum. There are no other acute displaced fractures. Mild right hip osteoarthritis. Ligaments Suboptimally assessed by CT. Muscles and Tendons No gross abnormalities. Soft tissues The soft tissues are grossly unremarkable. Atherosclerosis of the iliac and femoral vessels. Reconstructed images demonstrate no additional findings. IMPRESSION: 1. Comminuted intertrochanteric right hip fracture, with mild impaction and ventral angulation at the fracture site. Electronically Signed   By: Sharlet Salina M.D.   On: 09/27/2023 21:55   DG Hip Unilat With Pelvis 2-3 Views Right Result Date: 09/27/2023 CLINICAL DATA:  Right hip pain after fall EXAM: DG HIP (WITH OR WITHOUT PELVIS) 2-3V RIGHT COMPARISON:  None Available. FINDINGS: Acute nondisplaced right mildly impacted right femoral neck fracture. The femoral head remains  seated in the acetabulum. IMPRESSION: Acute nondisplaced right femoral neck fracture. Electronically Signed   By: Minerva Fester M.D.   On: 09/27/2023 20:59   DG Chest 1 View Result Date: 09/27/2023 CLINICAL DATA:  Right hip pain after falling EXAM: CHEST  1 VIEW COMPARISON:  08/03/2022 FINDINGS: Stable cardiomediastinal silhouette. Aortic atherosclerotic calcification. Hyperinflation and chronic bronchitic changes. No focal consolidation, pleural effusion, or pneumothorax. No displaced rib fractures. IMPRESSION: No active disease. Electronically Signed   By: Minerva Fester M.D.   On: 09/27/2023 20:55    Catarina Hartshorn, DO  [facility]  If 7PM-7AM, please contact night-coverage [email or web address] Password [email or web address] 09/30/2023, 11:23 AM   LOS: 3 days

## 2023-09-30 NOTE — Progress Notes (Signed)
   09/30/23 1222  Vitals  BP 95/67  BP Location Right Arm  BP Method Automatic  Patient Position (if appropriate) Sitting  Pulse Rate (!) 115  Pulse Rate Source Dinamap  Resp 19  MEWS COLOR  MEWS Score Color Yellow  MEWS Score  MEWS Temp 0  MEWS Systolic 1  MEWS Pulse 2  MEWS RR 0  MEWS LOC 0  MEWS Score 3  Provider Notification  Provider Name/Title Dr Tat  Date Provider Notified 09/30/23  Time Provider Notified 1227  Method of Notification Page (vital signs)  Notification Reason Other (Comment);Critical Result (heart rate 115, b/p 95/67)  Test performed and critical result vital signs  Date Critical Result Received 09/30/23  Time Critical Result Received 1222  Provider response See new orders  Date of Provider Response 09/30/23  Time of Provider Response 1231

## 2023-10-01 DIAGNOSIS — F32A Depression, unspecified: Secondary | ICD-10-CM | POA: Diagnosis not present

## 2023-10-01 DIAGNOSIS — S72144P Nondisplaced intertrochanteric fracture of right femur, subsequent encounter for closed fracture with malunion: Secondary | ICD-10-CM | POA: Diagnosis not present

## 2023-10-01 DIAGNOSIS — I471 Supraventricular tachycardia, unspecified: Secondary | ICD-10-CM | POA: Diagnosis not present

## 2023-10-01 DIAGNOSIS — F419 Anxiety disorder, unspecified: Secondary | ICD-10-CM | POA: Diagnosis not present

## 2023-10-01 LAB — BASIC METABOLIC PANEL
Anion gap: 5 (ref 5–15)
BUN: 19 mg/dL (ref 8–23)
CO2: 28 mmol/L (ref 22–32)
Calcium: 8 mg/dL — ABNORMAL LOW (ref 8.9–10.3)
Chloride: 102 mmol/L (ref 98–111)
Creatinine, Ser: 0.71 mg/dL (ref 0.44–1.00)
GFR, Estimated: >60 mL/min (ref >60)
Glucose, Bld: 89 mg/dL (ref 70–99)
Potassium: 3.9 mmol/L (ref 3.5–5.1)
Sodium: 135 mmol/L (ref 135–145)

## 2023-10-01 LAB — CBC
HCT: 23.2 % — ABNORMAL LOW (ref 36.0–46.0)
Hemoglobin: 7.4 g/dL — ABNORMAL LOW (ref 12.0–15.0)
MCH: 35.2 pg — ABNORMAL HIGH (ref 26.0–34.0)
MCHC: 31.9 g/dL (ref 30.0–36.0)
MCV: 110.5 fL — ABNORMAL HIGH (ref 80.0–100.0)
Platelets: 208 10*3/uL (ref 150–400)
RBC: 2.1 MIL/uL — ABNORMAL LOW (ref 3.87–5.11)
RDW: 12.3 % (ref 11.5–15.5)
WBC: 9.6 10*3/uL (ref 4.0–10.5)
nRBC: 0 % (ref 0.0–0.2)

## 2023-10-01 MED ORDER — FERROUS SULFATE 325 (65 FE) MG PO TABS
325.0000 mg | ORAL_TABLET | Freq: Two times a day (BID) | ORAL | Status: DC
  Administered 2023-10-01 – 2023-10-02 (×2): 325 mg via ORAL
  Filled 2023-10-01 (×2): qty 1

## 2023-10-01 MED ORDER — METOPROLOL TARTRATE 25 MG PO TABS: 12.5000 mg | ORAL_TABLET | Freq: Two times a day (BID) | ORAL | Status: AC

## 2023-10-01 MED ORDER — ALPRAZOLAM 1 MG PO TABS: 1.0000 mg | ORAL_TABLET | Freq: Three times a day (TID) | ORAL | 0 refills | Status: AC

## 2023-10-01 MED ORDER — ENSURE ENLIVE PO LIQD: 237.0000 mL | Freq: Three times a day (TID) | ORAL | Status: AC

## 2023-10-01 MED ORDER — FOLIC ACID 1 MG PO TABS: 1.0000 mg | ORAL_TABLET | Freq: Every day | ORAL | Status: AC

## 2023-10-01 MED ORDER — METOPROLOL TARTRATE 25 MG PO TABS
12.5000 mg | ORAL_TABLET | Freq: Two times a day (BID) | ORAL | Status: DC
  Administered 2023-10-01 – 2023-10-02 (×2): 12.5 mg via ORAL
  Filled 2023-10-01 (×2): qty 1

## 2023-10-01 MED ORDER — ACETAMINOPHEN 500 MG PO TABS
1000.0000 mg | ORAL_TABLET | Freq: Three times a day (TID) | ORAL | Status: DC
  Administered 2023-10-01 – 2023-10-02 (×3): 1000 mg via ORAL
  Filled 2023-10-01 (×3): qty 2

## 2023-10-01 MED ORDER — ASPIRIN 325 MG PO TABS
325.0000 mg | ORAL_TABLET | Freq: Every day | ORAL | Status: DC
  Filled 2023-10-01: qty 1

## 2023-10-01 MED ORDER — ASPIRIN 325 MG PO TABS: 325.0000 mg | ORAL_TABLET | Freq: Every day | ORAL | Status: AC

## 2023-10-01 MED ORDER — FERROUS SULFATE 325 (65 FE) MG PO TABS: 325.0000 mg | ORAL_TABLET | Freq: Two times a day (BID) | ORAL | Status: AC

## 2023-10-01 MED ORDER — TRAMADOL HCL 50 MG PO TABS: 50.0000 mg | ORAL_TABLET | Freq: Four times a day (QID) | ORAL | 0 refills | Status: DC | PRN

## 2023-10-01 MED ORDER — ACETAMINOPHEN 500 MG PO TABS: 1000.0000 mg | ORAL_TABLET | Freq: Three times a day (TID) | ORAL | Status: AC

## 2023-10-01 NOTE — TOC Transition Note (Signed)
 Transition of Care [facility]) - Discharge Note   Patient Details  Name: Maria Stevens MRN: [id number] Date of Birth: 1951/02/05  [facility] CM/SW Contact:  Elliot Gault, LCSW Phone Number: 10/01/2023, 11:08 AM   Clinical Narrative:     Pt medically stable for dc to [facility] today per MD. Updated Devonne Doughty at [facility] and they can admit pt today. Spoke with pt/husband and they remain in agreement with dc plan.  DC clinical sent electronically. Rn to call report. EMS arranged.  No other TOC needs for dc.  Final next level of care: Skilled Nursing Facility Barriers to Discharge: Barriers Resolved   Patient Goals and CMS Choice Patient states their goals for this hospitalization and ongoing recovery are:: anticipate SNF   Choice offered to / list presented to : Spouse [place] ownership interest in [facility].provided to::  (will discuss if SNF recommended)    Discharge Placement              Patient chooses bed at: [facility] Patient to be transferred to facility by: EMS Name of family member notified: Charlsie Merles Patient and family notified of of transfer: 10/01/23  Discharge Plan and Services Additional resources added to the After Visit Summary for   In-house Referral: Clinical Social Work                                   Social Drivers of Health (SDOH) Interventions SDOH Screenings   Food Insecurity: No Food Insecurity (09/27/2023)  Housing: Low Risk  (09/27/2023)  Transportation Needs: No Transportation Needs (09/27/2023)  Utilities: Not At Risk (09/27/2023)  Financial Resource Strain: Low Risk  (11/17/2022)   Received from [facility], [facility]  Physical Activity: Insufficiently Active (02/26/2021)   Received from [facility], [facility]  Social Connections: Socially Integrated (09/27/2023)  Stress: No Stress Concern Present (02/26/2021)   Received from [facility], [facility]  Tobacco Use:  Medium Risk (09/28/2023)  Health Literacy: Medium Risk (02/26/2021)   Received from [facility], [facility]     Readmission Risk Interventions     No data to display

## 2023-10-01 NOTE — Consult Note (Signed)
 [facility] Liaison Note  10/01/2023  Maria Stevens [birth year] [id number]  Location: RN Hospital Liaison screened the patient remotely at [facility].  Insurance: [employer]   Maria Stevens is a 73 y.o. female who is a Primary Care Patient of Vyas, Angelina Pih, MD [facility]. The patient was screened for readmission hospitalization with noted medium risk score for unplanned readmission risk with 1 IP in 6 months.  The patient was assessed for potential Care Management service needs for post hospital transition for care coordination. Review of patient's electronic medical record reveals patient was admitted closed non-displaced intertrochanteric fracture right femur. Pt recommended for SNF. Pt will transition to [facility] for ongoing STR. Liaison will collaborate with the PAC-RN to follow for community post discharge.  Plan: [facility] Liaison will continue to follow progress and disposition to asess for post hospital community care coordination/management needs.  Referral request for community care coordination: Liaison will refer to [facility] PAC-RN.   VBCI Care Management/Population Health does not replace or interfere with any arrangements made by the Inpatient Transition of Care team.   For questions contact:   Elliot Cousin, RN, BSN Hospital Liaison [place]   [facility], Population Health Office Hours MTWF  8:00 am-6:00 pm Direct Dial: [phone] mobile [email or web address]

## 2023-10-01 NOTE — Discharge Summary (Signed)
 Physician Discharge Summary   Patient: Maria Stevens MRN: [id number] DOB: Jun 14, 1951  Admit date:     09/27/2023  Discharge date: 10/01/23  Discharge Physician: Onalee Hua Joaquina Nissen   PCP: Ignatius Specking, MD   Recommendations at discharge:   Please follow up with primary care provider within 1-2 weeks  Please repeat BMP and CBC in one week   Hospital Course: 73 y.o. 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  She denies any presyncope or syncope.  No chest pain or palpitations.  No paresthesias or focal muscle weakness.  No dysuria, oliguria or hematuria or flank pain.  She is on home O2 at all times.  Pt was admitted with acute right hip fracture with plan to go to OR on 09/28/23 with Dr. Romeo Apple.  Patient underwent ORIF on 09/28/2023.  PT evaluation recommended skilled nursing facility with which the patient's family agreed.  The patient continued to be dependent on 3 L nasal cannula which is her home demand.  The patient was started on bronchodilators for her COPD.  She has continued to smoke at home.  The patient continued to drink alcohol although it was unclear when her last drink was.  There was no signs of alcohol withdrawal.  Assessment and Plan: Closed intertrochanteric fracture of the right femur -Status post ORIF 09/28/2023 -PT evaluation>> skilled nursing facility -Judicious opioids -continue DVT prophylaxis>>ASA 325 mg daily x 28 days per ortho   Chronic respiratory failure with hypoxia -Patient is chronically on 3 L nasal cannula at home -Stable presently    SVT/Atrial tachycardia -started low dose metoprolol -TSH 1.212 -improved   Alcohol dependence/abuse -Start thiamine -No signs of withdrawal   COPD -Continue  DuoNebs -Started Pulmicort   Tobacco abuse -Tobacco cessation discussed   Anxiety/depression -Continue home dose alprazolam -PDMP reviewed--alprazolam 1 mg, #90, last refill 09/14/2023 -Continue fluoxetine   GERD -Continue famotidine   Mixed hyperlipidemia -Continue statin   Cognitive impairment -Daughter endorses cognitive impairment at baseline   Acute Blood Loss Anemia -low Fe and folate>>supplementing         Consultants: ortho Procedures performed: ORIF right hip  Disposition: Skilled nursing facility Diet recommendation:  Regular diet DISCHARGE MEDICATION: Allergies as of 10/01/2023       Reactions   Carisoprodol    Homatropine    Hydrocodone    Sulfacetamide    Sulfamethoxazole    Trimethoprim    Aspirin Nausea Only        Medication List     STOP taking these medications    dexamethasone 0.5 MG/5ML elixir       TAKE these medications    acetaminophen 500 MG tablet Commonly known as: TYLENOL Take 2 tablets (1,000 mg total) by mouth every 8 (eight) hours.   ALPRAZolam 1 MG tablet Commonly known as: XANAX Take 1 tablet (1 mg total) by mouth 3 (three) times daily.   aspirin 325 MG tablet Take 1 tablet (325 mg total) by mouth daily. X 28 days Start taking on: October 02, 2023   ergocalciferol 1.25 MG (50000 UT) capsule Commonly known as: VITAMIN D2 Take 50,000 Units by mouth once a week.   famotidine 40 MG tablet Commonly known as: PEPCID Take 40 mg by mouth daily.   feeding supplement Liqd  Take 237 mLs by mouth 3 (three) times daily between meals.   ferrous sulfate 325 (65 FE) MG tablet Take 1 tablet (325 mg total) by mouth 2 (two) times daily with a meal.   FLUoxetine 40 MG capsule Commonly known as: PROZAC Take 40 mg by mouth daily.   folic acid 1 MG tablet Commonly known as: FOLVITE Take 1 tablet (1 mg total) by mouth daily. Start taking on: October 02, 2023   metoprolol tartrate 25 MG tablet Commonly known as:  LOPRESSOR Take 0.5 tablets (12.5 mg total) by mouth 2 (two) times daily.   rosuvastatin 20 MG tablet Commonly known as: CRESTOR Take 20 mg by mouth 2 (two) times a week.   traMADol 50 MG tablet Commonly known as: ULTRAM Take 1 tablet (50 mg total) by mouth every 6 (six) hours as needed for moderate pain (pain score 4-6).   Trelegy Ellipta 100-62.5-25 MCG/ACT Aepb Generic drug: Fluticasone-Umeclidin-Vilant Inhale 1 puff into the lungs daily.        Contact information for after-discharge care     Destination     [facility] .   Service: Skilled Nursing Contact information: [address] [place] [address] [phone]                    Discharge Exam: Ceasar Mons Weights   09/27/23 1844 09/28/23 1217  Weight: 40 kg 40 kg   HEENT:  [place]/AT, No thrush, no icterus CV:  RRR, no rub, no S3, no S4 Lung:  scattered rales. No wheeze Abd:  soft/+BS, NT Ext:  No edema, no lymphangitis, no synovitis, no rash   Condition at discharge: stable  The results of significant diagnostics from this hospitalization (including imaging, microbiology, ancillary and laboratory) are listed below for reference.   Imaging Studies: DG HIP UNILAT WITH PELVIS 2-3 VIEWS RIGHT Result Date: 09/28/2023 CLINICAL DATA:  Operative fixation of an intertrochanteric right hip fracture. EXAM: DG HIP (WITH OR WITHOUT PELVIS) 2-3V RIGHT COMPARISON:  Right hip CT obtained yesterday. FINDINGS: Twelve C-arm images of the right hip demonstrate compression screw and plate fixation of the previously demonstrated right intertrochanteric fracture with satisfactory position and alignment. No additional fractures seen. IMPRESSION: Operative fixation of a right intertrochanteric fracture with satisfactory position and alignment. Electronically Signed   By: Beckie Salts M.D.   On: 09/28/2023 18:07   DG C-Arm 1-60 Min-No Report Result Date: 09/28/2023 Fluoroscopy was utilized by  the requesting physician.  No radiographic interpretation.   CT 3D RECON AT SCANNER Result Date: 09/27/2023 CLINICAL DATA:  Nonspecific (abnormal) findings on radiological and other examination of musculoskeletal system right hip. EXAM: 3-DIMENSIONAL CT IMAGE RENDERING ON ACQUISITION WORKSTATION TECHNIQUE: 3-dimensional CT images were rendered by post-processing of the original CT data on an acquisition workstation. The 3-dimensional CT images were interpreted and findings were reported in the accompanying complete CT report for this study COMPARISON:  CT scan right hip same date. FINDINGS: Redemonstrated is a comminuted intertrochanteric right hip fracture including the lesser trochanter and fracture fragmentation extending along the greater trochanter. There is less than 1 cortex width of lateral displacement of the main distal fragment. The fracture is also slightly impacted but otherwise nondisplaced. The pelvic ring and right acetabulum are intact. Mild changes of right hip osteoarthritis are redemonstrated. Osteopenia. IMPRESSION: Comminuted intertrochanteric right hip fracture is redemonstrated as described above. Electronically Signed   By: Almira Bar M.D.   On: 09/27/2023 22:09   CT Hip Right  Wo Contrast Result Date: 09/27/2023 CLINICAL DATA:  Right hip pain after fall, right femoral neck fracture on x-ray EXAM: CT OF THE RIGHT HIP WITHOUT CONTRAST TECHNIQUE: Multidetector CT imaging of the right hip was performed according to the standard protocol. Multiplanar CT image reconstructions were also generated. RADIATION DOSE REDUCTION: This exam was performed according to the departmental dose-optimization program which includes automated exposure control, adjustment of the mA and/or kV according to patient size and/or use of iterative reconstruction technique. COMPARISON:  09/27/2023 FINDINGS: Bones/Joint/Cartilage There is a mildly comminuted inter trochanteric right hip fracture, with mild  impaction and ventral angulation at the fracture site. The right femoral head articulates normally with the right acetabulum. There are no other acute displaced fractures. Mild right hip osteoarthritis. Ligaments Suboptimally assessed by CT. Muscles and Tendons No gross abnormalities. Soft tissues The soft tissues are grossly unremarkable. Atherosclerosis of the iliac and femoral vessels. Reconstructed images demonstrate no additional findings. IMPRESSION: 1. Comminuted intertrochanteric right hip fracture, with mild impaction and ventral angulation at the fracture site. Electronically Signed   By: Sharlet Salina M.D.   On: 09/27/2023 21:55   DG Hip Unilat With Pelvis 2-3 Views Right Result Date: 09/27/2023 CLINICAL DATA:  Right hip pain after fall EXAM: DG HIP (WITH OR WITHOUT PELVIS) 2-3V RIGHT COMPARISON:  None Available. FINDINGS: Acute nondisplaced right mildly impacted right femoral neck fracture. The femoral head remains seated in the acetabulum. IMPRESSION: Acute nondisplaced right femoral neck fracture. Electronically Signed   By: Minerva Fester M.D.   On: 09/27/2023 20:59   DG Chest 1 View Result Date: 09/27/2023 CLINICAL DATA:  Right hip pain after falling EXAM: CHEST  1 VIEW COMPARISON:  08/03/2022 FINDINGS: Stable cardiomediastinal silhouette. Aortic atherosclerotic calcification. Hyperinflation and chronic bronchitic changes. No focal consolidation, pleural effusion, or pneumothorax. No displaced rib fractures. IMPRESSION: No active disease. Electronically Signed   By: Minerva Fester M.D.   On: 09/27/2023 20:55    Microbiology: Results for orders placed or performed during the hospital encounter of 09/27/23  Surgical pcr screen     Status: None   Collection Time: 09/28/23 10:50 AM   Specimen: Nasal Mucosa; Nasal Swab  Result Value Ref Range Status   MRSA, PCR NEGATIVE NEGATIVE Final   Staphylococcus aureus NEGATIVE NEGATIVE Final    Comment: (NOTE) The Xpert SA Assay (FDA approved  for NASAL specimens in patients 46 years of age and older), is one component of a comprehensive surveillance program. It is not intended to diagnose infection nor to guide or monitor treatment. Performed at [facility], [address], [place], [place] [address]     Labs: CBC: Recent Labs  Lab 09/27/23 1924 09/28/23 0447 09/29/23 0513 09/30/23 0500 10/01/23 0447  WBC 12.2* 9.5 14.4* 11.2* 9.6  NEUTROABS 9.7*  --   --   --   --   HGB 12.4 11.2* 9.3* 8.5* 7.4*  HCT 37.9 33.3* 28.6* 26.6* 23.2*  MCV 108.6* 108.5* 110.0* 110.4* 110.5*  PLT 211 195 165 163 208   Basic Metabolic Panel: Recent Labs  Lab 09/27/23 1924 09/28/23 0447 09/29/23 0513 10/01/23 0447  NA 138 137 135 135  K 4.7 4.1 3.9 3.9  CL 95* 98 100 102  CO2 28 30 28 28   GLUCOSE 107* 108* 124* 89  BUN 22 20 16 19   CREATININE 0.96 0.84 0.76 0.71  CALCIUM 10.1 9.2 8.8* 8.0*   Liver Function Tests: No results for input(s): "AST", "ALT", "ALKPHOS", "BILITOT", "PROT", "ALBUMIN" in the last  168 hours. CBG: No results for input(s): "GLUCAP" in the last 168 hours.  Discharge time spent: greater than 30 minutes.  Signed: Catarina Hartshorn, MD [facility] 10/01/2023

## 2023-10-01 NOTE — Plan of Care (Signed)

## 2023-10-01 NOTE — Final Consult Note (Signed)
  Postoperative plan Weightbearing as tolerated Wound check in 14 days  anticoagulation for 28 days aspirin 325 once a day Remove staples if present postop day 14 Follow-up visit at 2 weeks for x-rays and then x-rays at 6 weeks and 12 weeks

## 2023-10-01 NOTE — Progress Notes (Signed)
 Subjective: 3 Days Post-Op Procedure(s) (LRB): OPEN REDUCTION INTERNAL FIXATION HIP WITH PLATE AND SCREWS (Right) Requiring Tylenol for pain  Objective: Vital signs in last 24 hours: Temp:  [97.9 F (36.6 C)-98.7 F (37.1 C)] 97.9 F (36.6 C) (03/21 0607) Pulse Rate:  [69-115] 71 (03/21 0607) Resp:  [16-19] 16 (03/21 0607) BP: (95-112)/(54-67) 103/58 (03/21 0607) SpO2:  [99 %-100 %] 100 % (03/21 0607)  Intake/Output from previous day: 03/20 0701 - 03/21 0700 In: 1080 [P.O.:1080] Out: 750 [Urine:750] Intake/Output this shift: No intake/output data recorded.  Recent Labs    09/29/23 0513 09/30/23 0500 10/01/23 0447  HGB 9.3* 8.5* 7.4*   Recent Labs    09/30/23 0500 10/01/23 0447  WBC 11.2* 9.6  RBC 2.41* 2.10*  HCT 26.6* 23.2*  PLT 163 208   Recent Labs    09/29/23 0513 10/01/23 0447  NA 135 135  K 3.9 3.9  CL 100 102  CO2 28 28  BUN 16 19  CREATININE 0.76 0.71  GLUCOSE 124* 89  CALCIUM 8.8* 8.0*   No results for input(s): "LABPT", "INR" in the last 72 hours.  Patient was asleep.  Report from nurses that the patient got up with physical therapy but she is maximum assist   Assessment/Plan: 3 Days Post-Op Procedure(s) (LRB): OPEN REDUCTION INTERNAL FIXATION HIP WITH PLATE AND SCREWS (Right) Up with therapy Discharge to SNF when medically stable  Consider transfusion hemoglobin 7.4     Fuller Canada 10/01/2023, 7:28 AM

## 2023-10-01 NOTE — Plan of Care (Signed)
°  Problem: Health Behavior/Discharge Planning: Goal: Ability to manage health-related needs will improve Outcome: Progressing   Problem: Clinical Measurements: Goal: Will remain free from infection Outcome: Progressing Goal: Respiratory complications will improve Outcome: Progressing   Problem: Activity: Goal: Risk for activity intolerance will decrease Outcome: Progressing   Problem: Nutrition: Goal: Adequate nutrition will be maintained Outcome: Progressing

## 2023-10-01 NOTE — Care Management Important Message (Signed)
 Important Message  Patient Details  Name: Maria Stevens MRN: [id number] Date of Birth: 1951/03/18   Important Message Given:  Yes - Medicare IM     Corey Harold 10/01/2023, 11:29 AM

## 2023-10-02 DIAGNOSIS — J439 Emphysema, unspecified: Secondary | ICD-10-CM | POA: Diagnosis not present

## 2023-10-02 DIAGNOSIS — I471 Supraventricular tachycardia, unspecified: Secondary | ICD-10-CM | POA: Diagnosis not present

## 2023-10-02 DIAGNOSIS — K219 Gastro-esophageal reflux disease without esophagitis: Secondary | ICD-10-CM | POA: Diagnosis not present

## 2023-10-02 DIAGNOSIS — J99 Respiratory disorders in diseases classified elsewhere: Secondary | ICD-10-CM | POA: Diagnosis not present

## 2023-10-02 DIAGNOSIS — R262 Difficulty in walking, not elsewhere classified: Secondary | ICD-10-CM | POA: Diagnosis not present

## 2023-10-02 DIAGNOSIS — T8543XD Leakage of breast prosthesis and implant, subsequent encounter: Secondary | ICD-10-CM | POA: Diagnosis not present

## 2023-10-02 DIAGNOSIS — D649 Anemia, unspecified: Secondary | ICD-10-CM | POA: Diagnosis not present

## 2023-10-02 DIAGNOSIS — F419 Anxiety disorder, unspecified: Secondary | ICD-10-CM | POA: Diagnosis not present

## 2023-10-02 DIAGNOSIS — S72144P Nondisplaced intertrochanteric fracture of right femur, subsequent encounter for closed fracture with malunion: Secondary | ICD-10-CM | POA: Diagnosis not present

## 2023-10-02 DIAGNOSIS — S72144D Nondisplaced intertrochanteric fracture of right femur, subsequent encounter for closed fracture with routine healing: Secondary | ICD-10-CM | POA: Diagnosis not present

## 2023-10-02 DIAGNOSIS — J9611 Chronic respiratory failure with hypoxia: Secondary | ICD-10-CM | POA: Diagnosis not present

## 2023-10-02 DIAGNOSIS — M6281 Muscle weakness (generalized): Secondary | ICD-10-CM | POA: Diagnosis not present

## 2023-10-02 DIAGNOSIS — R278 Other lack of coordination: Secondary | ICD-10-CM | POA: Diagnosis not present

## 2023-10-02 DIAGNOSIS — G3184 Mild cognitive impairment, so stated: Secondary | ICD-10-CM | POA: Diagnosis not present

## 2023-10-02 DIAGNOSIS — E785 Hyperlipidemia, unspecified: Secondary | ICD-10-CM | POA: Diagnosis not present

## 2023-10-02 DIAGNOSIS — S72144A Nondisplaced intertrochanteric fracture of right femur, initial encounter for closed fracture: Secondary | ICD-10-CM | POA: Diagnosis not present

## 2023-10-02 DIAGNOSIS — I4719 Other supraventricular tachycardia: Secondary | ICD-10-CM | POA: Diagnosis not present

## 2023-10-02 DIAGNOSIS — F32A Depression, unspecified: Secondary | ICD-10-CM | POA: Diagnosis not present

## 2023-10-02 DIAGNOSIS — E440 Moderate protein-calorie malnutrition: Secondary | ICD-10-CM | POA: Diagnosis not present

## 2023-10-02 DIAGNOSIS — J449 Chronic obstructive pulmonary disease, unspecified: Secondary | ICD-10-CM | POA: Diagnosis not present

## 2023-10-02 DIAGNOSIS — I10 Essential (primary) hypertension: Secondary | ICD-10-CM | POA: Diagnosis not present

## 2023-10-02 DIAGNOSIS — K6389 Other specified diseases of intestine: Secondary | ICD-10-CM | POA: Diagnosis not present

## 2023-10-02 DIAGNOSIS — R69 Illness, unspecified: Secondary | ICD-10-CM | POA: Diagnosis not present

## 2023-10-02 DIAGNOSIS — S72144K Nondisplaced intertrochanteric fracture of right femur, subsequent encounter for closed fracture with nonunion: Secondary | ICD-10-CM | POA: Diagnosis not present

## 2023-10-02 DIAGNOSIS — I5020 Unspecified systolic (congestive) heart failure: Secondary | ICD-10-CM | POA: Diagnosis not present

## 2023-10-02 DIAGNOSIS — Z7401 Bed confinement status: Secondary | ICD-10-CM | POA: Diagnosis not present

## 2023-10-02 DIAGNOSIS — R5381 Other malaise: Secondary | ICD-10-CM | POA: Diagnosis not present

## 2023-10-02 DIAGNOSIS — C189 Malignant neoplasm of colon, unspecified: Secondary | ICD-10-CM | POA: Diagnosis not present

## 2023-10-02 NOTE — Progress Notes (Signed)
 Subjective: 4 Days Post-Op Procedure(s) (LRB): OPEN REDUCTION INTERNAL FIXATION HIP WITH PLATE AND SCREWS (Right) Patient reports pain as mild.    Objective: Vital signs in last 24 hours: Temp:  [97.5 F (36.4 C)-98.9 F (37.2 C)] 97.6 F (36.4 C) (03/22 0844) Pulse Rate:  [65-88] 67 (03/22 0844) Resp:  [18-20] 20 (03/22 0844) BP: (90-117)/(48-82) 100/54 (03/22 0844) SpO2:  [93 %-100 %] 100 % (03/22 0844)  Intake/Output from previous day: 03/21 0701 - 03/22 0700 In: 240 [P.O.:240] Out: -  Intake/Output this shift: No intake/output data recorded.  Recent Labs    09/30/23 0500 10/01/23 0447  HGB 8.5* 7.4*   Recent Labs    09/30/23 0500 10/01/23 0447  WBC 11.2* 9.6  RBC 2.41* 2.10*  HCT 26.6* 23.2*  PLT 163 208   Recent Labs    10/01/23 0447  NA 135  K 3.9  CL 102  CO2 28  BUN 19  CREATININE 0.71  GLUCOSE 89  CALCIUM 8.0*   No results for input(s): "LABPT", "INR" in the last 72 hours.  Neurologically intact Neurovascular intact Sensation intact distally Intact pulses distally Dorsiflexion/Plantar flexion intact Incision: dressing C/D/I Recommend iron supplementation  Assessment/Plan: 4 Days Post-Op Procedure(s) (LRB): OPEN REDUCTION INTERNAL FIXATION HIP WITH PLATE AND SCREWS (Right) Advance diet Up with therapy D/C IV fluids Discharge to SNF  Doing well pain well-controlled   Fuller Canada 10/02/2023, 8:50 AM

## 2023-10-02 NOTE — Discharge Summary (Signed)
 Physician Discharge Summary   Patient: Maria Stevens MRN: [id number] DOB: May 23, 1951  Admit date:     09/27/2023  Discharge date: 10/02/23  Discharge Physician: Onalee Hua Marven Veley   PCP: Maria Specking, MD   Recommendations at discharge:   Please follow up with primary care provider within 1-2 weeks  Please repeat BMP and CBC in one week    Hospital Course: 73 y.o. 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  She denies any presyncope or syncope.  No chest pain or palpitations.  No paresthesias or focal muscle weakness.  No dysuria, oliguria or hematuria or flank pain.  She is on home O2 at all times.  Pt was admitted with acute right hip fracture with plan to go to OR on 09/28/23 with Dr. Romeo Apple.  Patient underwent ORIF on 09/28/2023.  PT evaluation recommended skilled nursing facility with which the patient's family agreed.  The patient continued to be dependent on 3 L nasal cannula which is her home demand.  The patient was started on bronchodilators for her COPD.  She has continued to smoke at home.  The patient continued to drink alcohol although it was unclear when her last drink was.  There was no signs of alcohol withdrawal. The patient remained afebrile and hemodynamically stable.  She remained stable on her home 3L [name].  Her d/c was delayed on 10/01/23 to 3/22 due to lack of transport.  Assessment and Plan:     Closed intertrochanteric fracture of the right femur -Status post ORIF 09/28/2023 -PT evaluation>> skilled nursing facility -Judicious opioids -continue DVT prophylaxis>>ASA 325 mg daily x 28 days per ortho   Chronic respiratory failure with hypoxia -Patient is chronically on 3 L nasal cannula at home -Stable presently     SVT/Atrial tachycardia -started low dose metoprolol -TSH 1.212 -improved   Alcohol dependence/abuse -Start thiamine -No signs of withdrawal   COPD -Continue DuoNebs -Started Pulmicort   Tobacco abuse -Tobacco cessation discussed   Anxiety/depression -Continue home dose alprazolam -PDMP reviewed--alprazolam 1 mg, #90, last refill 09/14/2023 -Continue fluoxetine   GERD -Continue famotidine   Mixed hyperlipidemia -Continue statin   Cognitive impairment -Daughter endorses cognitive impairment at baseline   Acute Blood Loss Anemia -low Fe and folate>>supplementing    Consultants: ortho-[facility] Procedures performed: ORIF right hip  Disposition: Skilled nursing facility Diet recommendation:  Regular diet DISCHARGE MEDICATION: Allergies as of 10/02/2023       Reactions   Carisoprodol    Homatropine    Hydrocodone    Sulfacetamide    Sulfamethoxazole    Trimethoprim    Aspirin Nausea Only        Medication List     STOP taking these medications    dexamethasone 0.5 MG/5ML elixir       TAKE these medications    acetaminophen 500 MG tablet Commonly known as: TYLENOL Take 2 tablets (1,000 mg total) by mouth every 8 (eight) hours.   ALPRAZolam 1 MG tablet Commonly known as: XANAX Take 1 tablet (1 mg total) by mouth 3 (three) times daily.   aspirin 325 MG tablet Take 1 tablet (325 mg total) by mouth daily. X 28 days   ergocalciferol 1.25 MG (50000 UT) capsule Commonly known as: VITAMIN D2 Take 50,000 Units by mouth  once a week.   famotidine 40 MG tablet Commonly known as: PEPCID Take 40 mg by mouth daily.   feeding supplement Liqd Take 237 mLs by mouth 3 (three) times daily between meals.   ferrous sulfate 325 (65 FE) MG tablet Take 1 tablet (325 mg total) by mouth 2 (two) times daily with a meal.   FLUoxetine 40 MG capsule Commonly known as: PROZAC Take 40 mg by mouth daily.   folic acid 1 MG tablet Commonly known as: FOLVITE Take 1  tablet (1 mg total) by mouth daily.   metoprolol tartrate 25 MG tablet Commonly known as: LOPRESSOR Take 0.5 tablets (12.5 mg total) by mouth 2 (two) times daily.   rosuvastatin 20 MG tablet Commonly known as: CRESTOR Take 20 mg by mouth 2 (two) times a week.   traMADol 50 MG tablet Commonly known as: ULTRAM Take 1 tablet (50 mg total) by mouth every 6 (six) hours as needed for moderate pain (pain score 4-6).   Trelegy Ellipta 100-62.5-25 MCG/ACT Aepb Generic drug: Fluticasone-Umeclidin-Vilant Inhale 1 puff into the lungs daily.        Contact information for after-discharge care     Destination     [facility] .   Service: Skilled Nursing Contact information: [address] [place] [address] [place] [address] [phone]                    Discharge Exam: Ceasar Mons Weights   09/27/23 1844 09/28/23 1217  Weight: 40 kg 40 kg   HEENT:  /AT, No thrush, no icterus CV:  RRR, no rub, no S3, no S4 Lung:  bibasilar rales.  No wheeze Abd:  soft/+BS, NT Ext:  No edema, no lymphangitis, no synovitis, no rash   Condition at discharge: stable  The results of significant diagnostics from this hospitalization (including imaging, microbiology, ancillary and laboratory) are listed below for reference.   Imaging Studies: DG HIP UNILAT WITH PELVIS 2-3 VIEWS RIGHT Result Date: 09/28/2023 CLINICAL DATA:  Operative fixation of an intertrochanteric right hip fracture. EXAM: DG HIP (WITH OR WITHOUT PELVIS) 2-3V RIGHT COMPARISON:  Right hip CT obtained yesterday. FINDINGS: Twelve C-arm images of the right hip demonstrate compression screw and plate fixation of the previously demonstrated right intertrochanteric fracture with satisfactory position and alignment. No additional fractures seen. IMPRESSION: Operative fixation of a right intertrochanteric fracture with satisfactory position and alignment. Electronically Signed   By: Beckie Salts M.D.   On:  09/28/2023 18:07   DG C-Arm 1-60 Min-No Report Result Date: 09/28/2023 Fluoroscopy was utilized by the requesting physician.  No radiographic interpretation.   CT 3D RECON AT SCANNER Result Date: 09/27/2023 CLINICAL DATA:  Nonspecific (abnormal) findings on radiological and other examination of musculoskeletal system right hip. EXAM: 3-DIMENSIONAL CT IMAGE RENDERING ON ACQUISITION WORKSTATION TECHNIQUE: 3-dimensional CT images were rendered by post-processing of the original CT data on an acquisition workstation. The 3-dimensional CT images were interpreted and findings were reported in the accompanying complete CT report for this study COMPARISON:  CT scan right hip same date. FINDINGS: Redemonstrated is a comminuted intertrochanteric right hip fracture including the lesser trochanter and fracture fragmentation extending along the greater trochanter. There is less than 1 cortex width of lateral displacement of the main distal fragment. The fracture is also slightly impacted but otherwise nondisplaced. The pelvic ring and right acetabulum are intact. Mild changes of right hip osteoarthritis are redemonstrated. Osteopenia. IMPRESSION: Comminuted intertrochanteric right hip fracture is redemonstrated as described above.  Electronically Signed   By: Almira Bar M.D.   On: 09/27/2023 22:09   CT Hip Right Wo Contrast Result Date: 09/27/2023 CLINICAL DATA:  Right hip pain after fall, right femoral neck fracture on x-ray EXAM: CT OF THE RIGHT HIP WITHOUT CONTRAST TECHNIQUE: Multidetector CT imaging of the right hip was performed according to the standard protocol. Multiplanar CT image reconstructions were also generated. RADIATION DOSE REDUCTION: This exam was performed according to the departmental dose-optimization program which includes automated exposure control, adjustment of the mA and/or kV according to patient size and/or use of iterative reconstruction technique. COMPARISON:  09/27/2023 FINDINGS:  Bones/Joint/Cartilage There is a mildly comminuted inter trochanteric right hip fracture, with mild impaction and ventral angulation at the fracture site. The right femoral head articulates normally with the right acetabulum. There are no other acute displaced fractures. Mild right hip osteoarthritis. Ligaments Suboptimally assessed by CT. Muscles and Tendons No gross abnormalities. Soft tissues The soft tissues are grossly unremarkable. Atherosclerosis of the iliac and femoral vessels. Reconstructed images demonstrate no additional findings. IMPRESSION: 1. Comminuted intertrochanteric right hip fracture, with mild impaction and ventral angulation at the fracture site. Electronically Signed   By: Sharlet Salina M.D.   On: 09/27/2023 21:55   DG Hip Unilat With Pelvis 2-3 Views Right Result Date: 09/27/2023 CLINICAL DATA:  Right hip pain after fall EXAM: DG HIP (WITH OR WITHOUT PELVIS) 2-3V RIGHT COMPARISON:  None Available. FINDINGS: Acute nondisplaced right mildly impacted right femoral neck fracture. The femoral head remains seated in the acetabulum. IMPRESSION: Acute nondisplaced right femoral neck fracture. Electronically Signed   By: Minerva Fester M.D.   On: 09/27/2023 20:59   DG Chest 1 View Result Date: 09/27/2023 CLINICAL DATA:  Right hip pain after falling EXAM: CHEST  1 VIEW COMPARISON:  08/03/2022 FINDINGS: Stable cardiomediastinal silhouette. Aortic atherosclerotic calcification. Hyperinflation and chronic bronchitic changes. No focal consolidation, pleural effusion, or pneumothorax. No displaced rib fractures. IMPRESSION: No active disease. Electronically Signed   By: Minerva Fester M.D.   On: 09/27/2023 20:55    Microbiology: Results for orders placed or performed during the hospital encounter of 09/27/23  Surgical pcr screen     Status: None   Collection Time: 09/28/23 10:50 AM   Specimen: Nasal Mucosa; Nasal Swab  Result Value Ref Range Status   MRSA, PCR NEGATIVE NEGATIVE Final    Staphylococcus aureus NEGATIVE NEGATIVE Final    Comment: (NOTE) The Xpert SA Assay (FDA approved for NASAL specimens in patients 27 years of age and older), is one component of a comprehensive surveillance program. It is not intended to diagnose infection nor to guide or monitor treatment. Performed at [facility], [address] [place], [place] [address]     Labs: CBC: Recent Labs  Lab 09/27/23 1924 09/28/23 0447 09/29/23 0513 09/30/23 0500 10/01/23 0447  WBC 12.2* 9.5 14.4* 11.2* 9.6  NEUTROABS 9.7*  --   --   --   --   HGB 12.4 11.2* 9.3* 8.5* 7.4*  HCT 37.9 33.3* 28.6* 26.6* 23.2*  MCV 108.6* 108.5* 110.0* 110.4* 110.5*  PLT 211 195 165 163 208   Basic Metabolic Panel: Recent Labs  Lab 09/27/23 1924 09/28/23 0447 09/29/23 0513 10/01/23 0447  NA 138 137 135 135  K 4.7 4.1 3.9 3.9  CL 95* 98 100 102  CO2 28 30 28 28   GLUCOSE 107* 108* 124* 89  BUN 22 20 16 19   CREATININE 0.96 0.84 0.76 0.71  CALCIUM 10.1 9.2 8.8*  8.0*   Liver Function Tests: No results for input(s): "AST", "ALT", "ALKPHOS", "BILITOT", "PROT", "ALBUMIN" in the last 168 hours. CBG: No results for input(s): "GLUCAP" in the last 168 hours.  Discharge time spent: greater than 30 minutes.  Signed: Catarina Hartshorn, MD [facility] 10/02/2023

## 2023-10-02 NOTE — Plan of Care (Signed)

## 2023-10-02 NOTE — Progress Notes (Signed)
 Pt discharged via [facility] to [facility]. Pt A&O at time of discharge, scheduled am meds administered. Pt belongings with EMS personnel. Update called to [facility] to advise pt is on her way.

## 2023-10-04 DIAGNOSIS — R5381 Other malaise: Secondary | ICD-10-CM | POA: Diagnosis not present

## 2023-10-04 DIAGNOSIS — S72144A Nondisplaced intertrochanteric fracture of right femur, initial encounter for closed fracture: Secondary | ICD-10-CM | POA: Diagnosis not present

## 2023-10-04 DIAGNOSIS — E785 Hyperlipidemia, unspecified: Secondary | ICD-10-CM | POA: Diagnosis not present

## 2023-10-04 DIAGNOSIS — I471 Supraventricular tachycardia, unspecified: Secondary | ICD-10-CM | POA: Diagnosis not present

## 2023-10-04 DIAGNOSIS — J449 Chronic obstructive pulmonary disease, unspecified: Secondary | ICD-10-CM | POA: Diagnosis not present

## 2023-10-04 DIAGNOSIS — I5020 Unspecified systolic (congestive) heart failure: Secondary | ICD-10-CM | POA: Diagnosis not present

## 2023-10-05 DIAGNOSIS — J439 Emphysema, unspecified: Secondary | ICD-10-CM | POA: Diagnosis not present

## 2023-10-05 DIAGNOSIS — R5381 Other malaise: Secondary | ICD-10-CM | POA: Diagnosis not present

## 2023-10-05 DIAGNOSIS — S72144A Nondisplaced intertrochanteric fracture of right femur, initial encounter for closed fracture: Secondary | ICD-10-CM | POA: Diagnosis not present

## 2023-10-05 DIAGNOSIS — E440 Moderate protein-calorie malnutrition: Secondary | ICD-10-CM | POA: Diagnosis not present

## 2023-10-05 DIAGNOSIS — I10 Essential (primary) hypertension: Secondary | ICD-10-CM | POA: Diagnosis not present

## 2023-10-05 DIAGNOSIS — D649 Anemia, unspecified: Secondary | ICD-10-CM | POA: Diagnosis not present

## 2023-10-05 DIAGNOSIS — G3184 Mild cognitive impairment, so stated: Secondary | ICD-10-CM | POA: Diagnosis not present

## 2023-10-05 DIAGNOSIS — C189 Malignant neoplasm of colon, unspecified: Secondary | ICD-10-CM | POA: Diagnosis not present

## 2023-10-05 DIAGNOSIS — I5020 Unspecified systolic (congestive) heart failure: Secondary | ICD-10-CM | POA: Diagnosis not present

## 2023-10-05 DIAGNOSIS — J449 Chronic obstructive pulmonary disease, unspecified: Secondary | ICD-10-CM | POA: Diagnosis not present

## 2023-10-05 DIAGNOSIS — J9611 Chronic respiratory failure with hypoxia: Secondary | ICD-10-CM | POA: Diagnosis not present

## 2023-10-06 ENCOUNTER — Ambulatory Visit: Payer: Medicare Other | Admitting: Nurse Practitioner

## 2023-10-11 DIAGNOSIS — S72144D Nondisplaced intertrochanteric fracture of right femur, subsequent encounter for closed fracture with routine healing: Secondary | ICD-10-CM | POA: Diagnosis not present

## 2023-10-11 DIAGNOSIS — J9611 Chronic respiratory failure with hypoxia: Secondary | ICD-10-CM | POA: Diagnosis not present

## 2023-10-11 DIAGNOSIS — G3184 Mild cognitive impairment, so stated: Secondary | ICD-10-CM | POA: Diagnosis not present

## 2023-10-11 DIAGNOSIS — J439 Emphysema, unspecified: Secondary | ICD-10-CM | POA: Diagnosis not present

## 2023-10-11 DIAGNOSIS — I10 Essential (primary) hypertension: Secondary | ICD-10-CM | POA: Diagnosis not present

## 2023-10-11 DIAGNOSIS — E440 Moderate protein-calorie malnutrition: Secondary | ICD-10-CM | POA: Diagnosis not present

## 2023-10-11 DIAGNOSIS — J449 Chronic obstructive pulmonary disease, unspecified: Secondary | ICD-10-CM | POA: Diagnosis not present

## 2023-10-11 DIAGNOSIS — R5381 Other malaise: Secondary | ICD-10-CM | POA: Diagnosis not present

## 2023-10-11 DIAGNOSIS — D649 Anemia, unspecified: Secondary | ICD-10-CM | POA: Diagnosis not present

## 2023-10-11 DIAGNOSIS — C189 Malignant neoplasm of colon, unspecified: Secondary | ICD-10-CM | POA: Diagnosis not present

## 2023-10-11 DIAGNOSIS — S72144A Nondisplaced intertrochanteric fracture of right femur, initial encounter for closed fracture: Secondary | ICD-10-CM | POA: Diagnosis not present

## 2023-10-13 ENCOUNTER — Other Ambulatory Visit: Payer: Self-pay | Admitting: *Deleted

## 2023-10-13 DIAGNOSIS — J9611 Chronic respiratory failure with hypoxia: Secondary | ICD-10-CM

## 2023-10-13 NOTE — Patient Outreach (Addendum)
 Post-Acute Care Management follow up. Verified in [facility] Mrs. Osland discharged from [facility] on 10/11/23. Screening for potential complex care management services as benefit of health plan and primary care provider.  Secure communication sent to [employer] social work team to inquire about home health arrangements.   Will refer to [facility] complex care management team. Mrs. Mandarino has medical history of chronic respiratory failure on home 02, CHF, COPD, HTN.  Addendum: Update from [place], Admissions Director. [facility] home health was arranged for Mrs. Helene Kelp.  Raiford Noble, MSN, RN, BSN [place]  [facility], [facility] RN Post- Acute Care Manager Direct Dial: [phone]

## 2023-10-14 ENCOUNTER — Telehealth: Payer: Self-pay | Admitting: *Deleted

## 2023-10-14 DIAGNOSIS — E46 Unspecified protein-calorie malnutrition: Secondary | ICD-10-CM | POA: Diagnosis not present

## 2023-10-14 DIAGNOSIS — Z09 Encounter for follow-up examination after completed treatment for conditions other than malignant neoplasm: Secondary | ICD-10-CM | POA: Diagnosis not present

## 2023-10-14 DIAGNOSIS — G319 Degenerative disease of nervous system, unspecified: Secondary | ICD-10-CM | POA: Diagnosis not present

## 2023-10-14 DIAGNOSIS — D649 Anemia, unspecified: Secondary | ICD-10-CM | POA: Diagnosis not present

## 2023-10-14 DIAGNOSIS — R52 Pain, unspecified: Secondary | ICD-10-CM | POA: Diagnosis not present

## 2023-10-14 DIAGNOSIS — I10 Essential (primary) hypertension: Secondary | ICD-10-CM | POA: Diagnosis not present

## 2023-10-14 NOTE — Progress Notes (Signed)
 Complex Care Management Note Care Guide Note  10/14/2023 Name: Maria Stevens MRN: [id number] DOB: Mar 17, 1951   Complex Care Management Outreach Attempts: An unsuccessful telephone outreach was attempted today to offer the patient information about available complex care management services.  Follow Up Plan:  Additional outreach attempts will be made to offer the patient complex care management information and services.   Encounter Outcome:  No Answer  Gwenevere Ghazi  [facility] Health  [facility], [facility] Guide  Direct Dial: [phone]  Fax [phone]

## 2023-10-18 ENCOUNTER — Other Ambulatory Visit (INDEPENDENT_AMBULATORY_CARE_PROVIDER_SITE_OTHER): Payer: Self-pay

## 2023-10-18 ENCOUNTER — Encounter: Payer: Self-pay | Admitting: Orthopedic Surgery

## 2023-10-18 ENCOUNTER — Ambulatory Visit (INDEPENDENT_AMBULATORY_CARE_PROVIDER_SITE_OTHER): Admitting: Orthopedic Surgery

## 2023-10-18 DIAGNOSIS — C189 Malignant neoplasm of colon, unspecified: Secondary | ICD-10-CM | POA: Diagnosis not present

## 2023-10-18 DIAGNOSIS — I5020 Unspecified systolic (congestive) heart failure: Secondary | ICD-10-CM | POA: Diagnosis not present

## 2023-10-18 DIAGNOSIS — Z9181 History of falling: Secondary | ICD-10-CM | POA: Diagnosis not present

## 2023-10-18 DIAGNOSIS — J439 Emphysema, unspecified: Secondary | ICD-10-CM | POA: Diagnosis not present

## 2023-10-18 DIAGNOSIS — J9611 Chronic respiratory failure with hypoxia: Secondary | ICD-10-CM | POA: Diagnosis not present

## 2023-10-18 DIAGNOSIS — E785 Hyperlipidemia, unspecified: Secondary | ICD-10-CM | POA: Diagnosis not present

## 2023-10-18 DIAGNOSIS — G3184 Mild cognitive impairment, so stated: Secondary | ICD-10-CM | POA: Diagnosis not present

## 2023-10-18 DIAGNOSIS — Z9981 Dependence on supplemental oxygen: Secondary | ICD-10-CM | POA: Diagnosis not present

## 2023-10-18 DIAGNOSIS — S72144D Nondisplaced intertrochanteric fracture of right femur, subsequent encounter for closed fracture with routine healing: Secondary | ICD-10-CM

## 2023-10-18 DIAGNOSIS — I110 Hypertensive heart disease with heart failure: Secondary | ICD-10-CM | POA: Diagnosis not present

## 2023-10-18 DIAGNOSIS — Z7951 Long term (current) use of inhaled steroids: Secondary | ICD-10-CM | POA: Diagnosis not present

## 2023-10-18 DIAGNOSIS — I471 Supraventricular tachycardia, unspecified: Secondary | ICD-10-CM | POA: Diagnosis not present

## 2023-10-18 DIAGNOSIS — Z7982 Long term (current) use of aspirin: Secondary | ICD-10-CM | POA: Diagnosis not present

## 2023-10-18 DIAGNOSIS — E440 Moderate protein-calorie malnutrition: Secondary | ICD-10-CM | POA: Diagnosis not present

## 2023-10-18 DIAGNOSIS — K219 Gastro-esophageal reflux disease without esophagitis: Secondary | ICD-10-CM | POA: Diagnosis not present

## 2023-10-18 DIAGNOSIS — K6389 Other specified diseases of intestine: Secondary | ICD-10-CM | POA: Diagnosis not present

## 2023-10-18 DIAGNOSIS — F419 Anxiety disorder, unspecified: Secondary | ICD-10-CM | POA: Insufficient documentation

## 2023-10-18 DIAGNOSIS — D631 Anemia in chronic kidney disease: Secondary | ICD-10-CM | POA: Diagnosis not present

## 2023-10-18 DIAGNOSIS — Z87891 Personal history of nicotine dependence: Secondary | ICD-10-CM | POA: Diagnosis not present

## 2023-10-18 NOTE — Progress Notes (Signed)
   There were no vitals taken for this visit.  There is no height or weight on file to calculate BMI.  Chief Complaint  Patient presents with   Post-op Follow-up    Right hip     Encounter Diagnosis  Name Primary?   Closed nondisplaced intertrochanteric fracture of right femur with routine healing, subsequent encounter 09/28/23 ORIF compression plate/ screws Yes    DOI/DOS/ Date: 09/28/23  5 weeks postop open treatment internal fixation right hip compression hip screw  Weightbearing status as tolerated  DVT prophylaxis can be stopped now  DG HIP UNILAT WITH PELVIS 2-3 VIEWS RIGHT Result Date: 10/18/2023 Right hip x-rays AP lateral pelvic x-ray Patient had a intertrochanteric fracture of the right hip treated with dynamic hip screw Plate and screw construct right hip looks good on x-ray, we have a very good reduction good pin placement Impression stable fixation right hip right hip fracture    The patient is now at home.  She does complain of some dizziness and had some orthostatic hypotension as reported by her caregiver through the home nurses visits  She has been followed by Dr. Purcell Mouton is handling this.  Range of motion of the hip was very good without pain  X-ray in 6 weeks everything going well at this time

## 2023-10-18 NOTE — Progress Notes (Signed)
   There were no vitals taken for this visit.  There is no height or weight on file to calculate BMI.  Chief Complaint  Patient presents with   Post-op Follow-up    Right hip     Encounter Diagnosis  Name Primary?   Closed nondisplaced intertrochanteric fracture of right femur with routine healing, subsequent encounter 09/28/23 ORIF compression plate/ screws Yes    DOI/DOS/ Date: 09/28/23  Improved, standing some/ stood for xray

## 2023-10-18 NOTE — Progress Notes (Unsigned)
 Complex Care Management Note Care Guide Note  10/18/2023 Name: Maria Stevens MRN: [id number] DOB: 1951/06/13   Complex Care Management Outreach Attempts: A second unsuccessful outreach was attempted today to offer the patient with information about available complex care management services.  Follow Up Plan:  Additional outreach attempts will be made to offer the patient complex care management information and services.   Encounter Outcome:  No Answer  Gwenevere Ghazi  [facility] Health  [facility], [facility] Guide  Direct Dial: [phone]  Fax [phone]

## 2023-10-19 NOTE — Progress Notes (Signed)
 Complex Care Management Note  Care Guide Note 10/19/2023 Name: Maria Stevens MRN: [id number] DOB: 1950-10-10  Maria Stevens is a 73 y.o. year old female who sees Vyas, Angelina Pih, MD for primary care. I reached out to Pia Mau by phone today to offer complex care management services.  Ms. Govan was given information about Complex Care Management services today including:   The Complex Care Management services include support from the care team which includes your Nurse Care Manager, Clinical Social Worker, or Pharmacist.  The Complex Care Management team is here to help remove barriers to the health concerns and goals most important to you. Complex Care Management services are voluntary, and the patient may decline or stop services at any time by request to their care team member.   Complex Care Management Consent Status: Patient agreed to services and verbal consent obtained.   Follow up plan:  Telephone appointment with complex care management team member scheduled for:  4/10  Encounter Outcome:  Patient Scheduled  Gwenevere Ghazi  [facility] Health  [facility], [facility] Guide  Direct Dial: [phone]  Fax [phone]

## 2023-10-20 DIAGNOSIS — K6389 Other specified diseases of intestine: Secondary | ICD-10-CM | POA: Diagnosis not present

## 2023-10-20 DIAGNOSIS — K219 Gastro-esophageal reflux disease without esophagitis: Secondary | ICD-10-CM | POA: Diagnosis not present

## 2023-10-20 DIAGNOSIS — S72144D Nondisplaced intertrochanteric fracture of right femur, subsequent encounter for closed fracture with routine healing: Secondary | ICD-10-CM | POA: Diagnosis not present

## 2023-10-20 DIAGNOSIS — Z7951 Long term (current) use of inhaled steroids: Secondary | ICD-10-CM | POA: Diagnosis not present

## 2023-10-20 DIAGNOSIS — Z87891 Personal history of nicotine dependence: Secondary | ICD-10-CM | POA: Diagnosis not present

## 2023-10-20 DIAGNOSIS — J9611 Chronic respiratory failure with hypoxia: Secondary | ICD-10-CM | POA: Diagnosis not present

## 2023-10-20 DIAGNOSIS — I5020 Unspecified systolic (congestive) heart failure: Secondary | ICD-10-CM | POA: Diagnosis not present

## 2023-10-20 DIAGNOSIS — Z9181 History of falling: Secondary | ICD-10-CM | POA: Diagnosis not present

## 2023-10-20 DIAGNOSIS — E440 Moderate protein-calorie malnutrition: Secondary | ICD-10-CM | POA: Diagnosis not present

## 2023-10-20 DIAGNOSIS — E785 Hyperlipidemia, unspecified: Secondary | ICD-10-CM | POA: Diagnosis not present

## 2023-10-20 DIAGNOSIS — I110 Hypertensive heart disease with heart failure: Secondary | ICD-10-CM | POA: Diagnosis not present

## 2023-10-20 DIAGNOSIS — C189 Malignant neoplasm of colon, unspecified: Secondary | ICD-10-CM | POA: Diagnosis not present

## 2023-10-20 DIAGNOSIS — J439 Emphysema, unspecified: Secondary | ICD-10-CM | POA: Diagnosis not present

## 2023-10-20 DIAGNOSIS — Z9981 Dependence on supplemental oxygen: Secondary | ICD-10-CM | POA: Diagnosis not present

## 2023-10-20 DIAGNOSIS — I471 Supraventricular tachycardia, unspecified: Secondary | ICD-10-CM | POA: Diagnosis not present

## 2023-10-20 DIAGNOSIS — D631 Anemia in chronic kidney disease: Secondary | ICD-10-CM | POA: Diagnosis not present

## 2023-10-20 DIAGNOSIS — G3184 Mild cognitive impairment, so stated: Secondary | ICD-10-CM | POA: Diagnosis not present

## 2023-10-20 DIAGNOSIS — Z7982 Long term (current) use of aspirin: Secondary | ICD-10-CM | POA: Diagnosis not present

## 2023-10-21 ENCOUNTER — Other Ambulatory Visit: Payer: Self-pay | Admitting: *Deleted

## 2023-10-21 ENCOUNTER — Other Ambulatory Visit: Payer: Self-pay

## 2023-10-21 ENCOUNTER — Encounter: Payer: Self-pay | Admitting: *Deleted

## 2023-10-21 DIAGNOSIS — K6389 Other specified diseases of intestine: Secondary | ICD-10-CM | POA: Diagnosis not present

## 2023-10-21 DIAGNOSIS — E785 Hyperlipidemia, unspecified: Secondary | ICD-10-CM | POA: Diagnosis not present

## 2023-10-21 DIAGNOSIS — Z9181 History of falling: Secondary | ICD-10-CM | POA: Diagnosis not present

## 2023-10-21 DIAGNOSIS — Z87891 Personal history of nicotine dependence: Secondary | ICD-10-CM | POA: Diagnosis not present

## 2023-10-21 DIAGNOSIS — E440 Moderate protein-calorie malnutrition: Secondary | ICD-10-CM | POA: Diagnosis not present

## 2023-10-21 DIAGNOSIS — I110 Hypertensive heart disease with heart failure: Secondary | ICD-10-CM | POA: Diagnosis not present

## 2023-10-21 DIAGNOSIS — S72144D Nondisplaced intertrochanteric fracture of right femur, subsequent encounter for closed fracture with routine healing: Secondary | ICD-10-CM | POA: Diagnosis not present

## 2023-10-21 DIAGNOSIS — K219 Gastro-esophageal reflux disease without esophagitis: Secondary | ICD-10-CM | POA: Diagnosis not present

## 2023-10-21 DIAGNOSIS — Z9981 Dependence on supplemental oxygen: Secondary | ICD-10-CM | POA: Diagnosis not present

## 2023-10-21 DIAGNOSIS — I471 Supraventricular tachycardia, unspecified: Secondary | ICD-10-CM | POA: Diagnosis not present

## 2023-10-21 DIAGNOSIS — D631 Anemia in chronic kidney disease: Secondary | ICD-10-CM | POA: Diagnosis not present

## 2023-10-21 DIAGNOSIS — J9611 Chronic respiratory failure with hypoxia: Secondary | ICD-10-CM | POA: Diagnosis not present

## 2023-10-21 DIAGNOSIS — C189 Malignant neoplasm of colon, unspecified: Secondary | ICD-10-CM | POA: Diagnosis not present

## 2023-10-21 DIAGNOSIS — Z7982 Long term (current) use of aspirin: Secondary | ICD-10-CM | POA: Diagnosis not present

## 2023-10-21 DIAGNOSIS — G3184 Mild cognitive impairment, so stated: Secondary | ICD-10-CM | POA: Diagnosis not present

## 2023-10-21 DIAGNOSIS — I5020 Unspecified systolic (congestive) heart failure: Secondary | ICD-10-CM | POA: Diagnosis not present

## 2023-10-21 DIAGNOSIS — J439 Emphysema, unspecified: Secondary | ICD-10-CM | POA: Diagnosis not present

## 2023-10-21 DIAGNOSIS — Z7951 Long term (current) use of inhaled steroids: Secondary | ICD-10-CM | POA: Diagnosis not present

## 2023-10-21 NOTE — Patient Instructions (Signed)
 Visit Information  Thank you for taking time to visit with me today. Please don't hesitate to contact me if I can be of assistance to you before our next scheduled appointment.  Our next appointment is by telephone on 11/01/23 at 9:00 Please call the care guide team at [phone] if you need to cancel or reschedule your appointment.   Following is a copy of your care plan:   Goals Addressed             This Visit's Progress    VBCI RN Care Plan       Problems:  Chronic Disease Management support and education needs related to Osteoporosis, Anemia, and orthostatic hypotension. Support needed due to high fall risk Education needs regarding constipation associated with oral iron supplements  Goal: Over the next 6 months the Patient will demonstrate Improved health management independence as evidenced by practicing fall precautions and avoiding falls        Over the next 60 days the patient will work with HHPT and HHOT to increase strength,balance, and stamina Over the next week patient will have labs drawn by [facility] Over the next 2 weeks patient will increase intake of fluids (64 oz) and increase intake of fruits and vegetables  Interventions:   Falls Interventions: Reviewed medications and discussed potential side effects of medications such as dizziness and frequent urination Advised patient of importance of notifying provider of falls Assessed for signs and symptoms of orthostatic hypotension Assessed for falls since last encounter Advised patient to discuss low blood pressure with provider Discussed fall prevention strategies: Move carefully and change positions slowly Encouraged use of walker for ambulation  Anemia Interventions: Advised to take iron supplement as prescribed Advised to take take OTC B12 supplement as recommended by PCP Advised to start Folvite 1000 mg as prescribed at hospital discharge RN outreach to PCP office requesting prescription for  Folvite 1000mg  be sent to [facility] since prescription was not sent at hospital discharge and patient does not have this in her possession Provided PCP with Folate lab value of 4.9 on 09/30/23 Encouraged Increase fluid intake and intake of fruits and vegetables to combat constipation associated with iron supplement intake Recommended to discuss adding stool softener to medication regimen to manage constipation while on iron supplement  CBC    Component Value Date/Time   WBC 9.6 10/01/2023 0447   RBC 2.10 (L) 10/01/2023 0447   HGB 7.4 (L) 10/01/2023 0447   HCT 23.2 (L) 10/01/2023 0447   PLT 208 10/01/2023 0447   MCV 110.5 (H) 10/01/2023 0447   MCH 35.2 (H) 10/01/2023 0447   MCHC 31.9 10/01/2023 0447   RDW 12.3 10/01/2023 0447   LYMPHSABS 1.7 09/27/2023 1924   MONOABS 0.7 09/27/2023 1924   EOSABS 0.0 09/27/2023 1924   BASOSABS 0.1 09/27/2023 1924   Iron/TIBC/Ferritin/ %Sat    Component Value Date/Time   IRON 11 (L) 09/30/2023 0500   TIBC 159 (L) 09/30/2023 0500   FERRITIN 297 09/30/2023 0500   IRONPCTSAT 7 (L) 09/30/2023 0500   Low Blood Pressure: Encouraged to increase fluid intake Encouraged to wear compression socks as recommended by PCP Encouraged to move carefully and change positions slowly Verbal education on orthostatic hypotension provided Reviewed and discussed medications Advised to check and record blood pressure and pulse daily and PRN and to document any symptoms Advised to take log to provider visits Advised to reach out to provider with any systolic readings less than 100 or any persistently low readings  Outreached PCP office and verified that they have sent orders to [facility] for RN to draw labs on 10/22/23   Patient Self-Care Activities:  Attend all scheduled provider appointments Call provider office for new concerns or questions  Notify RN Care Manager of [facility] call rescheduling needs Take medications as prescribed   Increase fluid intake  with goal of 64 oz per day Increase intake of fruits and vegetables Work with HHPT and HHOT to increase strength, stamina, and balance with an ultimate goal of increasing independent activity level Use walker for ambulation Check and record blood pressure and heart rate and reach out to provider with low readings Follow fall prevention precautions  Plan:  Telephone follow up appointment with care management team member scheduled for:  11/01/23 at 9:00             Please call the [place] National Suicide Prevention Lifeline: 302-820-8724 or TTY: (403) 274-5912 TTY [phone]) to talk to a trained counselor call the [facility]: [phone] call 911 if you are experiencing a Mental Health or Behavioral Health Crisis or need someone to talk to.  The patient verbalized understanding of instructions, educational materials, and care plan provided today and agreed to receive a mailed copy of patient instructions, educational materials, and care plan.   Demetrios Loll, RN, BSN [place]  [facility] RN Care Manager Direct Dial: [phone]  Fax: [phone]

## 2023-10-21 NOTE — Patient Outreach (Signed)
 Complex Care Management   Visit Note  10/21/2023  Name:  Maria Stevens MRN: [id number] DOB: June 14, 1951  Situation: Referral received for Complex Care Management related to  osteoporosis, anemia, orthostatic hypotension, high risk for falls.  I obtained verbal consent from Pia Mau.  Visit completed with patient's husband, Ron, on the phone  Background:   Past Medical History:  Diagnosis Date   Colon cancer [facility])    Reported by patient   Emphysema of lung (HCC)    Hypertension     Assessment: Patient Reported Symptoms:  Cognitive Alert and oriented to person, place, and time, Insightful and able to interpret abstract concepts, Normal speech and language skills  Neurological Dizziness    HEENT No symptoms reported    Cardiovascular Dizziness    Respiratory Not assesed    Endocrine No symptoms reported    Gastrointestinal Constipation    Genitourinary Not assessed    Integumentary Other surgical incision  Musculoskeletal Weakness, Difficulty walking    Psychosocial No symptoms reported     Vitals:   10/21/23 1552  BP: 101/65    Medications Reviewed Today     Reviewed by Gwenith Daily, RN (Registered Nurse) on 10/21/23 at 1551  Med List Status: <None>   Medication Order Taking? Sig Documenting Provider Last Dose Status Informant  acetaminophen (TYLENOL) 500 MG tablet [id number] Yes Take 2 tablets (1,000 mg total) by mouth every 8 (eight) hours. Catarina Hartshorn, MD Taking Active   ALPRAZolam Prudy Feeler) 1 MG tablet [id number] Yes Take 1 tablet (1 mg total) by mouth 3 (three) times daily. Catarina Hartshorn, MD Taking Active   aspirin 325 MG tablet [id number] Yes Take 1 tablet (325 mg total) by mouth daily. X 28 days Tat, Onalee Hua, MD Taking Active   ergocalciferol (VITAMIN D2) 1.25 MG (50000 UT) capsule [id number] Yes Take 50,000 Units by mouth once a week. [provider] Taking Active Spouse/Significant Other  famotidine (PEPCID) 40 MG tablet [id number] Yes Take 40 mg  by mouth daily. [provider] Taking Active Spouse/Significant Other  feeding supplement (ENSURE ENLIVE / ENSURE PLUS) LIQD [id number] Yes Take 237 mLs by mouth 3 (three) times daily between meals. Catarina Hartshorn, MD Taking Active   ferrous sulfate 325 (65 FE) MG tablet [id number] Yes Take 1 tablet (325 mg total) by mouth 2 (two) times daily with a meal. Tat, David, MD Taking Active   FLUoxetine (PROZAC) 40 MG capsule [id number] Yes Take 40 mg by mouth daily. [provider] Taking Active Spouse/Significant Other  Fluticasone-Umeclidin-Vilant (TRELEGY ELLIPTA) 100-62.5-25 MCG/ACT AEPB [id number] Yes Inhale 1 puff into the lungs daily. Leslye Peer, MD Taking Active Spouse/Significant Other  folic acid (FOLVITE) 1 MG tablet [id number] No Take 1 tablet (1 mg total) by mouth daily.  Patient not taking: Reported on 10/21/2023   Catarina Hartshorn, MD Not Taking Active            Med Note Lillia Mountain, Clydene Laming Oct 21, 2023  3:50 PM) Prescribed at hospital discharge but not sent to pharmacy. Will ask PCP to prescribe and send to [facility].  metoprolol tartrate (LOPRESSOR) 25 MG tablet [id number] No Take 0.5 tablets (12.5 mg total) by mouth 2 (two) times daily.  Patient not taking: Reported on 10/21/2023   Catarina Hartshorn, MD Not Taking Active            Med Note Lillia Mountain, Danelia Snodgrass N   Thu Oct 21, 2023  3:24 PM) Holding due to soft BPs  rosuvastatin (CRESTOR) 20 MG tablet [id number] Yes Take 20 mg by mouth 2 (two) times a week. [provider] Taking Active Spouse/Significant Other  traMADol (ULTRAM) 50 MG tablet [id number] Yes Take 1 tablet (50 mg total) by mouth every 6 (six) hours as needed for moderate pain (pain score 4-6). Catarina Hartshorn, MD Taking Active             Recommendation:   PCP Follow-up  Follow Up Plan:   Telephone follow up appointment date/time:  11/01/23 at 9:00  Demetrios Loll, RN, BSN [place]  [facility] RN Care Manager Direct Dial: [phone]  Fax:  [phone]

## 2023-10-23 DIAGNOSIS — J449 Chronic obstructive pulmonary disease, unspecified: Secondary | ICD-10-CM | POA: Diagnosis not present

## 2023-11-01 ENCOUNTER — Encounter: Payer: Self-pay | Admitting: *Deleted

## 2023-11-01 ENCOUNTER — Telehealth: Payer: Self-pay | Admitting: *Deleted

## 2023-11-01 DIAGNOSIS — S72144D Nondisplaced intertrochanteric fracture of right femur, subsequent encounter for closed fracture with routine healing: Secondary | ICD-10-CM | POA: Diagnosis not present

## 2023-11-01 DIAGNOSIS — I110 Hypertensive heart disease with heart failure: Secondary | ICD-10-CM | POA: Diagnosis not present

## 2023-11-01 DIAGNOSIS — J439 Emphysema, unspecified: Secondary | ICD-10-CM | POA: Diagnosis not present

## 2023-11-01 DIAGNOSIS — J9611 Chronic respiratory failure with hypoxia: Secondary | ICD-10-CM | POA: Diagnosis not present

## 2023-11-09 ENCOUNTER — Telehealth: Payer: Self-pay

## 2023-11-09 NOTE — Progress Notes (Signed)
 Complex Care Management Note Care Guide Note  11/11/2023 Name: Maria Stevens MRN: [id number] DOB: [birth year]   Complex Care Management Outreach Attempts: A second unsuccessful outreach was attempted today to offer the patient with information about available complex care management services.  Follow Up Plan:  No further outreach attempts will be made at this time. We have been unable to contact the patient to offer or enroll patient in complex care management services.  Encounter Outcome:  No Answer  [facility]  [facility] Care Management Assistant Direct Dial: [phone]  Fax: [phone]

## 2023-11-09 NOTE — Telephone Encounter (Signed)
-----   Message from Nurse Deloras Fess sent at 11/01/2023  9:22 AM EDT ----- Please contact to reschedule.

## 2023-11-16 DIAGNOSIS — Z7189 Other specified counseling: Secondary | ICD-10-CM | POA: Diagnosis not present

## 2023-11-16 DIAGNOSIS — Z1389 Encounter for screening for other disorder: Secondary | ICD-10-CM | POA: Diagnosis not present

## 2023-11-16 DIAGNOSIS — Z299 Encounter for prophylactic measures, unspecified: Secondary | ICD-10-CM | POA: Diagnosis not present

## 2023-11-16 DIAGNOSIS — I700 Atherosclerosis of aorta: Secondary | ICD-10-CM | POA: Diagnosis not present

## 2023-11-16 DIAGNOSIS — F17210 Nicotine dependence, cigarettes, uncomplicated: Secondary | ICD-10-CM | POA: Diagnosis not present

## 2023-11-16 DIAGNOSIS — J441 Chronic obstructive pulmonary disease with (acute) exacerbation: Secondary | ICD-10-CM | POA: Diagnosis not present

## 2023-11-16 DIAGNOSIS — I10 Essential (primary) hypertension: Secondary | ICD-10-CM | POA: Diagnosis not present

## 2023-11-16 DIAGNOSIS — Z0000 Encounter for general adult medical examination without abnormal findings: Secondary | ICD-10-CM | POA: Diagnosis not present

## 2023-11-22 DIAGNOSIS — J449 Chronic obstructive pulmonary disease, unspecified: Secondary | ICD-10-CM | POA: Diagnosis not present

## 2023-11-29 ENCOUNTER — Ambulatory Visit: Admitting: Nurse Practitioner

## 2023-12-02 ENCOUNTER — Encounter: Admitting: Orthopedic Surgery

## 2023-12-22 ENCOUNTER — Encounter: Admitting: Orthopedic Surgery

## 2024-01-11 DIAGNOSIS — F17210 Nicotine dependence, cigarettes, uncomplicated: Secondary | ICD-10-CM | POA: Diagnosis not present

## 2024-01-11 DIAGNOSIS — I10 Essential (primary) hypertension: Secondary | ICD-10-CM | POA: Diagnosis not present

## 2024-01-11 DIAGNOSIS — M25512 Pain in left shoulder: Secondary | ICD-10-CM | POA: Diagnosis not present

## 2024-01-11 DIAGNOSIS — R6884 Jaw pain: Secondary | ICD-10-CM | POA: Diagnosis not present

## 2024-01-11 DIAGNOSIS — Z299 Encounter for prophylactic measures, unspecified: Secondary | ICD-10-CM | POA: Diagnosis not present

## 2024-01-13 DIAGNOSIS — Z299 Encounter for prophylactic measures, unspecified: Secondary | ICD-10-CM | POA: Diagnosis not present

## 2024-01-13 DIAGNOSIS — I10 Essential (primary) hypertension: Secondary | ICD-10-CM | POA: Diagnosis not present

## 2024-01-13 DIAGNOSIS — J449 Chronic obstructive pulmonary disease, unspecified: Secondary | ICD-10-CM | POA: Diagnosis not present

## 2024-01-13 DIAGNOSIS — D490 Neoplasm of unspecified behavior of digestive system: Secondary | ICD-10-CM | POA: Diagnosis not present

## 2024-01-18 DIAGNOSIS — Z299 Encounter for prophylactic measures, unspecified: Secondary | ICD-10-CM | POA: Diagnosis not present

## 2024-01-18 DIAGNOSIS — J9611 Chronic respiratory failure with hypoxia: Secondary | ICD-10-CM | POA: Diagnosis not present

## 2024-01-18 DIAGNOSIS — I10 Essential (primary) hypertension: Secondary | ICD-10-CM | POA: Diagnosis not present

## 2024-01-18 DIAGNOSIS — K1120 Sialoadenitis, unspecified: Secondary | ICD-10-CM | POA: Diagnosis not present

## 2024-02-04 ENCOUNTER — Encounter (INDEPENDENT_AMBULATORY_CARE_PROVIDER_SITE_OTHER): Payer: Self-pay | Admitting: Otolaryngology

## 2024-02-04 ENCOUNTER — Ambulatory Visit (INDEPENDENT_AMBULATORY_CARE_PROVIDER_SITE_OTHER): Admitting: Otolaryngology

## 2024-02-04 ENCOUNTER — Telehealth (INDEPENDENT_AMBULATORY_CARE_PROVIDER_SITE_OTHER): Payer: Self-pay | Admitting: Otolaryngology

## 2024-02-04 VITALS — BP 127/75 | HR 104

## 2024-02-04 DIAGNOSIS — R6884 Jaw pain: Secondary | ICD-10-CM | POA: Diagnosis not present

## 2024-02-04 DIAGNOSIS — F17210 Nicotine dependence, cigarettes, uncomplicated: Secondary | ICD-10-CM | POA: Diagnosis not present

## 2024-02-04 DIAGNOSIS — Z720 Tobacco use: Secondary | ICD-10-CM

## 2024-02-04 DIAGNOSIS — K1379 Other lesions of oral mucosa: Secondary | ICD-10-CM

## 2024-02-04 MED ORDER — AMOXICILLIN-POT CLAVULANATE 875-125 MG PO TABS: 1.0000 | ORAL_TABLET | Freq: Two times a day (BID) | ORAL | 0 refills | Status: AC

## 2024-02-04 MED ORDER — METHYLPREDNISOLONE 4 MG PO TBPK: ORAL_TABLET | ORAL | 1 refills | Status: AC

## 2024-02-04 NOTE — Telephone Encounter (Signed)
 Patient's husband, [name], called and stated that during his wife's visit with Dr. Okey this morning, she mentioned having the patient go for bloodwork before her follow up.  Patient would like to know if she can get her bloodwork done at [facility] and what number she can call to schedule.  Please call her husband, [name], at [phone]

## 2024-02-04 NOTE — Progress Notes (Signed)
 ENT CONSULT:  Reason for Consult: jaw and oral pain    HPI: Discussed the use of AI scribe software for clinical note transcription with the patient, who gave verbal consent to proceed.  History of Present Illness Maria Stevens is a 73 year old female who presents with a painful knot under her tongue following a tooth extraction.  She developed a painful knot on her tongue a few days after a tooth extraction performed by her dentist in February or March. The knot causes significant pain. She experiences pain across the bottom of her mouth and jaw.  She completed a course of amoxicillin about a week ago, which did not alleviate her symptoms. She also tried using 2% lidocaine , which provided minimal relief. No submandibular gland swelling.   She is a long-term smoker, having smoked for fifty years, currently smoking about four to five cigarettes a day, though some days she smokes less.   Records Reviewed:  PCP visit 07/30/23 for COPD management and aortic atherosclerosis/bronchitis  Admitted in March 3/17-3/22/25 for right femoral fracture and had surgery for it     Past Medical History:  Diagnosis Date   Colon cancer [facility])    Reported by patient   Emphysema of lung (HCC)    Hypertension     Past Surgical History:  Procedure Laterality Date   ORIF HIP FRACTURE Right 09/28/2023   Procedure: OPEN REDUCTION INTERNAL FIXATION HIP WITH PLATE AND SCREWS;  Surgeon: Margrette Taft BRAVO, MD;  Location: [facility];  Service: Orthopedics;  Laterality: Right;  compression hip, 2 hole plate   PLACEMENT OF BREAST IMPLANTS     reported by patient    Family History  Problem Relation Age of Onset   Heart disease Mother    Emphysema Father    Allergies Sister    Cancer Brother     Social History:  reports that she quit smoking about 3 years ago. Her smoking use included cigarettes. She has never used smokeless tobacco. She reports that she does not currently use alcohol . No history on file for  drug use.  Allergies:  Allergies  Allergen Reactions   Carisoprodol    Homatropine    Hydrocodone    Sulfacetamide    Sulfamethoxazole    Trimethoprim    Aspirin  Nausea Only    Medications: I have reviewed the patient's current medications.  The PMH, PSH, Medications, Allergies, and SH were reviewed and updated.  ROS: Constitutional: Negative for fever, weight loss and weight gain. Cardiovascular: Negative for chest pain and dyspnea on exertion. Respiratory: Is not experiencing shortness of breath at rest. Gastrointestinal: Negative for nausea and vomiting. Neurological: Negative for headaches. Psychiatric: The patient is not nervous/anxious  Blood pressure 127/75, pulse (!) 104, SpO2 91%. There is no height or weight on file to calculate BMI.  PHYSICAL EXAM:  Exam: General: Well-developed, well-nourished Communication and Voice: Clear pitch and clarity Respiratory Respiratory effort: Equal inspiration and expiration without stridor Cardiovascular Peripheral Vascular: Warm extremities with equal color/perfusion Eyes: No nystagmus with equal extraocular motion bilaterally Neuro/Psych/Balance: Patient oriented to person, place, and time; Appropriate mood and affect; Gait is intact with no imbalance; Cranial nerves I-XII are intact Head and Face Inspection: Normocephalic and atraumatic without mass or lesion Palpation: Facial skeleton intact without bony stepoffs Salivary Glands: No mass or tenderness Facial Strength: Facial motility symmetric and full bilaterally ENT Pinna: External ear intact and fully developed External canal: Canal is patent with intact skin Tympanic Membrane: Clear and mobile External Nose:  No scar or anatomic deformity Internal Nose: Septum is relatively straight.  TMJ: No pain to palpation with full mobility Oral cavity/oropharynx: No erythema or exudate, no lesions present Left Wharton duct papilla is red and tender, cannot rule out stone,  patient is extremely sensitive and tender along the area. Poor dentition multiple teeth missing. No floor of mouth induration or edema   Neck Neck and Trachea: Midline trachea without mass or lesion Thyroid : No mass or nodularity Lymphatics: No lymphadenopathy   Studies Reviewed: CXR 09/27/23 FINDINGS: Stable cardiomediastinal silhouette. Aortic atherosclerotic calcification. Hyperinflation and chronic bronchitic changes. No focal consolidation, pleural effusion, or pneumothorax. No displaced rib fractures.   IMPRESSION: No active disease  Assessment/Plan: Encounter Diagnoses  Name Primary?   Jaw pain Yes   Mouth pain     Assessment and Plan Assessment & Plan Pain along salivary duct and floor of mouth radiating along her jaw x 4 months following dental extraction Severe pain atypical for salivary stone, possible infection or inflammation considered but her salivary gland on the left side is not enlarged. Red and tender left Wharton duct papilla, cannot rule out stone at the papilla but she is extremely tender. Needs imaging to better evaluate - Order CT neck to evaluate for salivary stone or other causes of severe pain - Prescribed antibiotics and steroids for infection and inflammation. Augmentin and Medrol  pack - Advise ibuprofen every six hours, alternating with acetaminophen  every six hours for pain management.  Tobacco use disorder.  We had an extensive discussion about detrimental effects of smoking on overall health. I provided resources available at [facility] to assist with smoking cessation. I spent 4 min on counseling  - Advised smoking cessation    Thank you for allowing me to participate in the care of this patient. Please do not hesitate to contact me with any questions or concerns.   Elena Larry, MD Otolaryngology [facility] Phone: [phone] Fax: [phone]    02/04/2024, 10:33 AM

## 2024-02-14 ENCOUNTER — Telehealth (INDEPENDENT_AMBULATORY_CARE_PROVIDER_SITE_OTHER): Payer: Self-pay | Admitting: Otolaryngology

## 2024-02-14 NOTE — Telephone Encounter (Signed)
 Patient husband left a voicemail stating that her symptoms have gotten worse and wanted to know if she can be seen sooner or extend the medication.

## 2024-02-15 ENCOUNTER — Telehealth (INDEPENDENT_AMBULATORY_CARE_PROVIDER_SITE_OTHER): Payer: Self-pay | Admitting: Otolaryngology

## 2024-02-15 NOTE — Telephone Encounter (Signed)
 Patient's husband and Caregiver, Jonie Burdell, LVM stating patient's condition has worsened since seeing Dr. Soldatova.  Her speech is effected, it is hard for her to swallow, the growth has increased in size and is ulcerated.  She is scheduled for a CT scan on 02/16/2024.  He would like a sooner appt than 02/24/2024.  I spoke with Dr. Vallery MA, [place], and she ok's offering an appt on 02/21/2024 at 1:45pm or 2:30pm.  Called patient and LVM requesting a call back.

## 2024-02-16 ENCOUNTER — Ambulatory Visit (HOSPITAL_COMMUNITY)
Admission: RE | Admit: 2024-02-16 | Discharge: 2024-02-16 | Disposition: A | Discharge status: Home / Self Care | Source: Ambulatory Visit | Attending: Otolaryngology | Admitting: Otolaryngology

## 2024-02-16 DIAGNOSIS — R6884 Jaw pain: Secondary | ICD-10-CM

## 2024-02-16 DIAGNOSIS — I6523 Occlusion and stenosis of bilateral carotid arteries: Secondary | ICD-10-CM | POA: Diagnosis not present

## 2024-02-16 MED ORDER — IOHEXOL 300 MG/ML  SOLN
75.0000 mL | Freq: Once | INTRAMUSCULAR | Status: AC | PRN
  Administered 2024-02-16: 75 mL via INTRAVENOUS

## 2024-02-22 ENCOUNTER — Telehealth (INDEPENDENT_AMBULATORY_CARE_PROVIDER_SITE_OTHER): Payer: Self-pay | Admitting: Otolaryngology

## 2024-02-22 ENCOUNTER — Ambulatory Visit (INDEPENDENT_AMBULATORY_CARE_PROVIDER_SITE_OTHER): Payer: Self-pay

## 2024-02-22 NOTE — Telephone Encounter (Signed)
 Requesting imaging results.  Please call.

## 2024-02-22 NOTE — Telephone Encounter (Signed)
 LVM for patient to give [place] a callback regarding results

## 2024-02-23 ENCOUNTER — Ambulatory Visit: Admitting: Emergency Medicine

## 2024-02-23 ENCOUNTER — Telehealth (INDEPENDENT_AMBULATORY_CARE_PROVIDER_SITE_OTHER): Payer: Self-pay

## 2024-02-23 ENCOUNTER — Encounter: Payer: Self-pay | Admitting: Emergency Medicine

## 2024-02-23 VITALS — BP 94/61 | HR 81 | Temp 97.7°F | Ht 61.0 in | Wt 90.8 lb

## 2024-02-23 DIAGNOSIS — J449 Chronic obstructive pulmonary disease, unspecified: Secondary | ICD-10-CM | POA: Diagnosis not present

## 2024-02-23 DIAGNOSIS — J9611 Chronic respiratory failure with hypoxia: Secondary | ICD-10-CM | POA: Diagnosis not present

## 2024-02-23 MED ORDER — TRELEGY ELLIPTA 100-62.5-25 MCG/ACT IN AEPB: 1.0000 | INHALATION_SPRAY | Freq: Every day | RESPIRATORY_TRACT | 5 refills | Status: DC

## 2024-02-23 NOTE — Assessment & Plan Note (Signed)
 Please continue Trelegy as you have been taking it.  Rinse and gargle after using. Keep your albuterol  available to use 2 puffs when needed for shortness of breath, chest tightness, wheezing. Follow-up in our office in 6 months. Follow with Dr. Shelah in 1 year, sooner if you have any problems.

## 2024-02-23 NOTE — Telephone Encounter (Signed)
 Spoke to patient's husband about CT results. Patient is still in pain. Husband understood results.

## 2024-02-23 NOTE — Assessment & Plan Note (Signed)
 Concerning that she has poor compliance with exertion and that she has symptoms of dizziness, weakness when she exerts herself on room air.  I think she is under using.  Asked them to get a pulse oximeter so we can monitor more closely.  Also asked her to wear 4 L/min by POC with all exertion since that is what her titration recommended.  Continue your oxygen  at 4 L/min at night while sleeping. We need to make sure that you are wearing your oxygen  at 4 L/min with your portable oxygen  concentrator more reliably whenever you are up exerting yourself.  I am concerned that low oxygen  levels with exertion might be contributing to your falls. Keep track of your oxygen  saturations.  Our goal is for you to stay above 90%.

## 2024-02-23 NOTE — Patient Instructions (Signed)
 Please continue Trelegy as you have been taking it.  Rinse and gargle after using. Keep your albuterol  available to use 2 puffs when needed for shortness of breath, chest tightness, wheezing. Continue your oxygen  at 4 L/min at night while sleeping. We need to make sure that you are wearing your oxygen  at 4 L/min with your portable oxygen  concentrator more reliably whenever you are up exerting yourself.  I am concerned that low oxygen  levels with exertion might be contributing to your falls. Keep track of your oxygen  saturations.  Our goal is for you to stay above 90%. Follow-up in our office in 6 months. Follow with Dr. Shelah in 1 year, sooner if you have any problems.

## 2024-02-23 NOTE — Progress Notes (Signed)
 Subjective:    Patient ID: Maria Stevens, female    DOB: July 21, 1950, 73 y.o.   MRN: [id number]  COPD Her past medical history is significant for COPD.   ROV 02/25/2023 --73 year old woman follows up today for history of COPD and associated chronic hypoxemic respiratory failure.  Also with multiple scattered cavitary pulmonary nodules, decreased in size on serial CT chest.  She had an acute exacerbation at the beginning of the year, was hospitalized in May after a fall and noted to have extensive left lower lobe and right middle lobe atelectasis with mucous plugging.  She was treated for pneumonia at that time.  Has been maintained on Trelegy.  She was tried on azithromycin 3 times weekly earlier this year, did not really seem to change how she was feeling so discontinued.  Uses albuterol  HFA rarely. Has DuoNeb but tries to minimize. No real cough.  Oxygen  set at 3L/min Today she reports that she has fallen a few times, usually due to difficulty seeing, tripping. She believes her breathing has been doing ok, has recovered some since May. She is having dyspnea associated w anxiety and panic attacks. These seem to be better.   ROV 02/23/2024 --follow-up visit 73 year old woman with a history of COPD and associated chronic hypoxemic respiratory failure, cavitary pulmonary nodular disease that have been followed with surveillance CT scans of the chest.  She has been maintained on Trelegy.  Has been on scheduled azithromycin in the past but did not seem to get much benefit so not currently.  Her oxygen  is at 3 L/min.  She is admitted with a right femur fracture, L shoulder injury in March 2025. She is not coughing, denies significant SOB at rest, gets weak/dizzy w ambulation. She does have frequent falls. She wears 4L/min at night. Uses POC at 4L/min but not reliably. Seldom uses albuterol .     Review of Systems As per HPI      Objective:   Physical Exam Vitals:   02/23/24 1116  BP: 94/61  Pulse:  81  Temp: 97.7 F (36.5 C)  SpO2: 94%  Weight: 90 lb 12.8 oz (41.2 kg)  Height: 5' 1 (1.549 m)    Gen: Pleasant, very thin w some temporal wasting, in no distress,  normal affect  ENT: No lesions,  mouth clear, poor dentition, oropharynx clear, no postnasal drip  Neck: No JVD, no stridor  Lungs: No use of accessory muscles, very distant no wheeze.    Cardiovascular: RRR, heart sounds normal, no murmur or gallops, no peripheral edema  Musculoskeletal: No deformities, no cyanosis or clubbing  Neuro: alert, awake, non focal  Skin: Warm, no lesions or rash      Assessment & Plan:   COPD (chronic obstructive pulmonary disease) (HCC) Please continue Trelegy as you have been taking it.  Rinse and gargle after using. Keep your albuterol  available to use 2 puffs when needed for shortness of breath, chest tightness, wheezing. Follow-up in our office in 6 months. Follow with Dr. Shelah in 1 year, sooner if you have any problems.  Chronic hypoxemic respiratory failure (HCC) Concerning that she has poor compliance with exertion and that she has symptoms of dizziness, weakness when she exerts herself on room air.  I think she is under using.  Asked them to get a pulse oximeter so we can monitor more closely.  Also asked her to wear 4 L/min by POC with all exertion since that is what her titration recommended.  Continue your oxygen   at 4 L/min at night while sleeping. We need to make sure that you are wearing your oxygen  at 4 L/min with your portable oxygen  concentrator more reliably whenever you are up exerting yourself.  I am concerned that low oxygen  levels with exertion might be contributing to your falls. Keep track of your oxygen  saturations.  Our goal is for you to stay above 90%.      Lamar Chris, MD, PhD 02/23/2024, 11:58 AM [facility] [phone] or if no answer before 7:00PM call [phone] For any issues after 7:00PM please call eLink  [phone]

## 2024-02-24 ENCOUNTER — Encounter (INDEPENDENT_AMBULATORY_CARE_PROVIDER_SITE_OTHER): Payer: Self-pay | Admitting: Otolaryngology

## 2024-02-24 ENCOUNTER — Ambulatory Visit (INDEPENDENT_AMBULATORY_CARE_PROVIDER_SITE_OTHER): Admitting: Otolaryngology

## 2024-02-24 VITALS — BP 108/64 | HR 99

## 2024-02-24 DIAGNOSIS — R6884 Jaw pain: Secondary | ICD-10-CM | POA: Diagnosis not present

## 2024-02-24 DIAGNOSIS — Z720 Tobacco use: Secondary | ICD-10-CM

## 2024-02-24 DIAGNOSIS — K1379 Other lesions of oral mucosa: Secondary | ICD-10-CM

## 2024-02-24 MED ORDER — LIDOCAINE VISCOUS HCL 2 % MT SOLN
15.0000 mL | OROMUCOSAL | 0 refills | Status: AC | PRN
Start: 2024-02-24 — End: ?

## 2024-02-24 MED ORDER — PREDNISONE 10 MG PO TABS
10.0000 mg | ORAL_TABLET | Freq: Every day | ORAL | 0 refills | Status: AC
Start: 2024-02-24 — End: ?

## 2024-02-24 MED ORDER — TRAMADOL HCL 50 MG PO TABS
50.0000 mg | ORAL_TABLET | Freq: Three times a day (TID) | ORAL | 0 refills | Status: AC | PRN
Start: 2024-02-24 — End: ?

## 2024-02-24 MED ORDER — DOXYCYCLINE HYCLATE 100 MG PO TABS: 100.0000 mg | ORAL_TABLET | Freq: Two times a day (BID) | ORAL | 0 refills | Status: AC

## 2024-02-24 NOTE — Progress Notes (Signed)
 ENT Progress Note:   Update 02/24/2024  Discussed the use of AI scribe software for clinical note transcription with the patient, who gave verbal consent to proceed.  History of Present Illness Maria Stevens is a 73 year old female who presents with persistent oral pain and discomfort following a dental extraction after CT neck which showed no abnormalities.   She experiences persistent pain and discomfort in the area where a tooth was extracted at the end of January. The pain is located at the back of her mouth, where the tooth was pulled (left mandibular molar area). She describes the pain as severe, affecting her ability to swallow and sleep, and notes that it feels like 'soft days and night' and 'burns'.  She has been using Tylenol  and Oragel for pain relief, but these have not been effective. She has also been taking Motrin, which was recommended to her. Previously, she was prescribed an antibiotic, which did not alleviate her symptoms. She mentions that she has tried topical lidocaine , which numbed the area but did not provide significant relief.  Her teeth have been separating, creating more space between them, and she experiences tenderness in the jaw area. She has a history of seeing an oral surgeon and had her teeth pulled years ago, followed by getting a new set. She has also been prescribed prednisone  in the past, which she recalls helped relieve pain from a ruptured disc.  Records Reviewed:  Initial Evaluation  Reason for Consult: jaw and oral pain    HPI: Discussed the use of AI scribe software for clinical note transcription with the patient, who gave verbal consent to proceed.  History of Present Illness Maria Stevens is a 73 year old female who presents with a painful knot under her tongue following a tooth extraction.  She developed a painful knot on her tongue a few days after a tooth extraction performed by her dentist in February or March. The knot causes  significant pain. She experiences pain across the bottom of her mouth and jaw.  She completed a course of amoxicillin  about a week ago, which did not alleviate her symptoms. She also tried using 2% lidocaine , which provided minimal relief. No submandibular gland swelling.   She is a long-term smoker, having smoked for fifty years, currently smoking about four to five cigarettes a day, though some days she smokes less.   Maria Stevens is a 73 year old female who presents with persistent oral pain and discomfort following a dental extraction.  She experiences persistent pain and discomfort in the area where a tooth was extracted at the end of January. The pain is located at the back of her mouth, where the tooth was pulled. She describes the pain as severe, affecting her ability to swallow and sleep, and notes that it feels like 'soft days and night' and 'burns'.  She has been using Tylenol  and Oragel for pain relief, but these have not been effective. She has also been taking Motrin, which was recommended to her. Previously, she was prescribed an antibiotic, which did not alleviate her symptoms. She mentions that she has tried topical lidocaine , which numbed the area but did not provide significant relief.  Her teeth have been separating, creating more space between them, and she experiences tenderness in the jaw area. She has a history of seeing an oral surgeon and had her teeth pulled years ago, followed by getting a new set. She has also been prescribed prednisone  in the past, which  she recalls helped relieve pain from a ruptured disc.  She reports a growth under her tongue that feels constricted and mentions that it seemed to improve slightly after taking Augmentin . Despite these treatments, she continues to experience significant pain and difficulty eating.  Pain in the jaw area, tenderness, and difficulty swallowing.   Records Reviewed:  PCP visit 07/30/23 for COPD management and aortic  atherosclerosis/bronchitis  Admitted in March 3/17-3/22/25 for right femoral fracture and had surgery for it     Past Medical History:  Diagnosis Date   Colon cancer [facility])    Reported by patient   Emphysema of lung (HCC)    Hypertension     Past Surgical History:  Procedure Laterality Date   ORIF HIP FRACTURE Right 09/28/2023   Procedure: OPEN REDUCTION INTERNAL FIXATION HIP WITH PLATE AND SCREWS;  Surgeon: Margrette Taft BRAVO, MD;  Location: [facility];  Service: Orthopedics;  Laterality: Right;  compression hip, 2 hole plate   PLACEMENT OF BREAST IMPLANTS     reported by patient    Family History  Problem Relation Age of Onset   Heart disease Mother    Emphysema Father    Allergies Sister    Cancer Brother     Social History:  reports that she has been smoking cigarettes. She has never used smokeless tobacco. She reports that she does not currently use alcohol . No history on file for drug use.  Allergies:  Allergies  Allergen Reactions   Carisoprodol    Homatropine    Hydrocodone    Sulfacetamide    Sulfamethoxazole    Trimethoprim    Aspirin  Nausea Only    Medications: I have reviewed the patient's current medications.  The PMH, PSH, Medications, Allergies, and SH were reviewed and updated.  ROS: Constitutional: Negative for fever, weight loss and weight gain. Cardiovascular: Negative for chest pain and dyspnea on exertion. Respiratory: Is not experiencing shortness of breath at rest. Gastrointestinal: Negative for nausea and vomiting. Neurological: Negative for headaches. Psychiatric: The patient is not nervous/anxious  Blood pressure 108/64, pulse 99, SpO2 92%. There is no height or weight on file to calculate BMI.  PHYSICAL EXAM:  Exam: General: Well-developed, well-nourished Communication and Voice: Clear pitch and clarity Respiratory Respiratory effort: Equal inspiration and expiration without stridor Cardiovascular Peripheral Vascular: Warm  extremities with equal color/perfusion Eyes: No nystagmus with equal extraocular motion bilaterally Neuro/Psych/Balance: Patient oriented to person, place, and time; Appropriate mood and affect; Gait is intact with no imbalance; Cranial nerves I-XII are intact Head and Face Inspection: Normocephalic and atraumatic without mass or lesion Palpation: Facial skeleton intact without bony stepoffs Salivary Glands: No mass or tenderness Facial Strength: Facial motility symmetric and full bilaterally ENT Pinna: External ear intact and fully developed External canal: Canal is patent with intact skin Tympanic Membrane: Clear and mobile External Nose: No scar or anatomic deformity Internal Nose: Septum is relatively straight.  TMJ: No pain to palpation with full mobility Oral cavity/oropharynx: No erythema or exudate, no lesions present Left Wharton duct papilla redness improved patient tender along lingual surface of the mandible along mandibular tori. Multiple missing teeth and signs of dental decay Neck Neck and Trachea: Midline trachea without mass or lesion Thyroid : No mass or nodularity Lymphatics: No lymphadenopathy   Studies Reviewed: CXR 09/27/23 FINDINGS: Stable cardiomediastinal silhouette. Aortic atherosclerotic calcification. Hyperinflation and chronic bronchitic changes. No focal consolidation, pleural effusion, or pneumothorax. No displaced rib fractures.   IMPRESSION: No active disease  CT neck w/contrast 02/16/24 FINDINGS:  Pharynx: The nasopharynx, oropharynx and hypopharynx are normal   Oral cavity/floor of mouth: Normal   Larynx: Normal   Salivary glands: The parotid and submandibular glands are normal   Thyroid : Normal   Lymph nodes: No adenopathy   Vascular: There is calcific plaque in the carotid arteries   Limited intracranial: No significant abnormality   Visualized orbits: No significant abnormality   Mastoids and visualized paranasal sinuses: No  significant abnormality   Skeleton: The mandible is normal   Upper chest: No significant abnormality   Other: None   IMPRESSION: No abnormal mass, adenopathy or inflammatory process  Assessment/Plan: Encounter Diagnoses  Name Primary?   Jaw pain Yes   Mouth pain    Tobacco abuse      Assessment and Plan Assessment & Plan Pain along salivary duct and floor of mouth radiating along her jaw x 4 months following dental extraction Severe pain atypical for salivary stone, possible infection or inflammation considered but her salivary gland on the left side is not enlarged. Red and tender left Wharton duct papilla, cannot rule out stone at the papilla but she is extremely tender. Needs imaging to better evaluate - Order CT neck to evaluate for salivary stone or other causes of severe pain - Prescribed antibiotics and steroids for infection and inflammation. Augmentin  and Medrol  pack - Advise ibuprofen every six hours, alternating with acetaminophen  every six hours for pain management.  Tobacco use disorder.  We had an extensive discussion about detrimental effects of smoking on overall health. I provided resources available at [facility] to assist with smoking cessation. I spent 4 min on counseling  - Advised smoking cessation   Update 02/24/24 Jaw pain  Jaw and oral pain after dental extraction with area of tenderness along lingual surface of the anterior mandible, no evidence of sialoadenitis on exam or CT neck and no stones.  CT scan showed no salivary gland pathology or any other pathology. Pain likely related to jaw or dental issue. Recommend OMFS evaluation. We will give pain control medications, topical viscous lidocaine  and another course of antibiotic (Doxy) and steroid to help with pain - Refer to oral surgery at [facility] for further evaluation and imaging. - Prescribed tramadol  for pain management in addition to Tylenol  and Motrin - Prescribed topical lidocaine  for  symptomatic relief. - Prescribe a course of prednisone  for inflammation and pain. - Fax referral to [facility] oral surgery program. - Doxy 100  mg BID x 10 days     Elena Larry, MD Otolaryngology [facility] Phone: [phone] Fax: [phone]    02/24/2024, 1:51 PM

## 2024-03-01 ENCOUNTER — Encounter (INDEPENDENT_AMBULATORY_CARE_PROVIDER_SITE_OTHER): Payer: Self-pay

## 2024-03-22 DIAGNOSIS — D490 Neoplasm of unspecified behavior of digestive system: Secondary | ICD-10-CM | POA: Diagnosis not present

## 2024-03-23 DIAGNOSIS — K1120 Sialoadenitis, unspecified: Secondary | ICD-10-CM | POA: Diagnosis not present

## 2024-04-05 DIAGNOSIS — K1379 Other lesions of oral mucosa: Secondary | ICD-10-CM | POA: Diagnosis not present

## 2024-04-05 DIAGNOSIS — Z0000 Encounter for general adult medical examination without abnormal findings: Secondary | ICD-10-CM | POA: Diagnosis not present

## 2024-04-05 DIAGNOSIS — E7800 Pure hypercholesterolemia, unspecified: Secondary | ICD-10-CM | POA: Diagnosis not present

## 2024-04-05 DIAGNOSIS — Z299 Encounter for prophylactic measures, unspecified: Secondary | ICD-10-CM | POA: Diagnosis not present

## 2024-04-05 DIAGNOSIS — R5383 Other fatigue: Secondary | ICD-10-CM | POA: Diagnosis not present

## 2024-04-05 DIAGNOSIS — C060 Malignant neoplasm of cheek mucosa: Secondary | ICD-10-CM | POA: Diagnosis not present

## 2024-04-05 DIAGNOSIS — I10 Essential (primary) hypertension: Secondary | ICD-10-CM | POA: Diagnosis not present

## 2024-04-05 DIAGNOSIS — Z79899 Other long term (current) drug therapy: Secondary | ICD-10-CM | POA: Diagnosis not present

## 2024-04-12 DIAGNOSIS — Z7689 Persons encountering health services in other specified circumstances: Secondary | ICD-10-CM | POA: Diagnosis not present

## 2024-04-12 DIAGNOSIS — C069 Malignant neoplasm of mouth, unspecified: Secondary | ICD-10-CM | POA: Diagnosis not present

## 2024-04-17 ENCOUNTER — Ambulatory Visit: Admitting: Pulmonary Disease

## 2024-04-18 ENCOUNTER — Ambulatory Visit

## 2024-04-18 VITALS — BP 108/64 | HR 83 | Temp 97.9°F | Ht 61.0 in | Wt 84.6 lb

## 2024-04-18 DIAGNOSIS — J4489 Other specified chronic obstructive pulmonary disease: Secondary | ICD-10-CM

## 2024-04-18 DIAGNOSIS — Z01818 Encounter for other preprocedural examination: Secondary | ICD-10-CM | POA: Diagnosis not present

## 2024-04-18 DIAGNOSIS — J9611 Chronic respiratory failure with hypoxia: Secondary | ICD-10-CM | POA: Diagnosis not present

## 2024-04-18 DIAGNOSIS — C069 Malignant neoplasm of mouth, unspecified: Secondary | ICD-10-CM

## 2024-04-18 NOTE — Patient Instructions (Signed)
 It was a pleasure to see you today. Your pulmonary function test will be scheduled

## 2024-04-18 NOTE — Assessment & Plan Note (Addendum)
 Patient on 3 to 4 L oxygen  at all times.  Advised her to continue to use oxygen  24/7 Orders:   Pulmonary function test; Future

## 2024-04-18 NOTE — Progress Notes (Signed)
 New Patient Pulmonology Office Visit   Subjective:  Patient ID: Maria Stevens, female    DOB: Dec 06, 1950  MRN: [id number]  Referred by: Rosamond Leta NOVAK, MD  CC:  Chief Complaint  Patient presents with   COPD    Pt stated she was diagnosed with mouth cancer, pt needs a clearance for anesthesia due to being diagnosed with mouth cancer. SOB comes and goes pt stated she gets dizzy often Prod cough ( clear, white)    COPD Her past medical history is significant for COPD.   Maria Stevens is a 73 y.o. female who follows with Dr Shelah in this clinic for COPD and chronic hypoxic respiratory failure typically.  Last seen by him in August, 2025.  She was followed for multiple scattered cavitary pulm nodules, which decreased in size on serial CT chest.  Has had pneumonia during hospital admissions after a fall and left proximal humerus fracture in 2024. She was admitted with acute right hip fracture in 09/2023 and tolerated ORIF well, didn't have pulmonary complications then.  Denies COPD exacerbations in last 6 months. She is on Trelegy daily.  Previously tried on azithromycin 3 times weekly but discontinued due to suboptimal response. Reports compliance to trelegy She is on 4 L oxygen  at all times. She continues to smoke. She was admitted to the hospital in March/2025 for right femur fracture.  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 She wanted to be seen in this clinic to discuss her options including preoperative pulmonary clearance.    ROS Review of symptoms negative except mentioned above   Allergies: Carisoprodol, Homatropine, Hydrocodone, Sulfacetamide, Sulfamethoxazole, Trimethoprim, and Aspirin   Current Outpatient Medications:    acetaminophen  (TYLENOL ) 500 MG tablet, Take 2 tablets (1,000 mg total) by mouth every 8 (eight) hours., Disp: , Rfl:    ALPRAZolam  (XANAX ) 1 MG tablet, Take 1 tablet (1 mg total) by mouth 3  (three) times daily., Disp: 10 tablet, Rfl: 0   ergocalciferol  (VITAMIN D2) 1.25 MG (50000 UT) capsule, Take 50,000 Units by mouth once a week., Disp: , Rfl:    famotidine  (PEPCID ) 40 MG tablet, Take 40 mg by mouth daily., Disp: , Rfl:    lidocaine  (XYLOCAINE ) 2 % solution, Use as directed 15 mLs in the mouth or throat as needed for mouth pain., Disp: 100 mL, Rfl: 0   rosuvastatin  (CRESTOR ) 20 MG tablet, Take 20 mg by mouth 2 (two) times a week., Disp: , Rfl:    amoxicillin -clavulanate (AUGMENTIN ) 875-125 MG tablet, Take 1 tablet by mouth 2 (two) times daily. (Patient not taking: Reported on 02/24/2024), Disp: 20 tablet, Rfl: 0   aspirin  325 MG tablet, Take 1 tablet (325 mg total) by mouth daily. X 28 days (Patient not taking: Reported on 04/18/2024), Disp: , Rfl:    doxycycline  (VIBRA -TABS) 100 MG tablet, Take 1 tablet (100 mg total) by mouth 2 (two) times daily. (Patient not taking: Reported on 04/18/2024), Disp: 20 tablet, Rfl: 0   feeding supplement (ENSURE ENLIVE / ENSURE PLUS) LIQD, Take 237 mLs by mouth 3 (three) times daily between meals. (Patient not taking: Reported on 04/18/2024), Disp: , Rfl:    ferrous sulfate  325 (65 FE) MG tablet, Take 1 tablet (325 mg total) by mouth 2 (two) times daily with a meal. (Patient not taking: Reported on 02/24/2024), Disp: , Rfl:    FLUoxetine  (PROZAC ) 40 MG capsule, Take 40 mg by mouth daily., Disp: , Rfl:  Fluticasone -Umeclidin-Vilant (TRELEGY ELLIPTA ) 100-62.5-25 MCG/ACT AEPB, Inhale 1 puff into the lungs daily., Disp: 60 each, Rfl: 5   folic acid  (FOLVITE ) 1 MG tablet, Take 1 tablet (1 mg total) by mouth daily., Disp: , Rfl:    methylPREDNISolone  (MEDROL  DOSEPAK) 4 MG TBPK tablet, Take with signs of chronic sinusitis and take as directed (Patient not taking: Reported on 02/24/2024), Disp: 1 each, Rfl: 1   metoprolol  tartrate (LOPRESSOR ) 25 MG tablet, Take 0.5 tablets (12.5 mg total) by mouth 2 (two) times daily. (Patient not taking: Reported on 04/18/2024),  Disp: , Rfl:    mirtazapine (REMERON) 15 MG tablet, Take 7.5 mg by mouth at bedtime. (Patient not taking: Reported on 04/18/2024), Disp: , Rfl:    predniSONE  (DELTASONE ) 10 MG tablet, Take 1 tablet (10 mg total) by mouth daily with breakfast. Take two pills/day for 3 days then one pill/day for 3 days (Patient not taking: Reported on 04/18/2024), Disp: 9 tablet, Rfl: 0   traMADol  (ULTRAM ) 50 MG tablet, Take 1 tablet (50 mg total) by mouth every 8 (eight) hours as needed. 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[facility])    Past Surgical History:  Procedure Laterality Date   ORIF HIP FRACTURE Right 09/28/2023   Procedure: OPEN REDUCTION INTERNAL FIXATION HIP WITH PLATE AND SCREWS;  Surgeon: Margrette Taft BRAVO, MD;  Location: [facility];  Service: Orthopedics;  Laterality: Right;  compression hip, 2 hole plate   PLACEMENT OF BREAST IMPLANTS     reported by patient   Family History  Problem Relation Age of Onset   Heart disease Mother    Emphysema Father    Allergies Sister    Cancer Brother    Social History   Socioeconomic History   Marital status: Married    Spouse name: Not on file   Number of children: Not on file   Years of education: Not on file   Highest education level: Not on file  Occupational History   Not on file  Tobacco Use   Smoking status: Some Days    Current packs/day: 0.00    Types: Cigarettes    Last attempt to quit: 01/30/2021    Years since quitting: 3.2   Smokeless tobacco: Never   Tobacco comments:    Pt smokes due to anxiety. 02/23/2024    Pt smokes when stressed 04/18/2024  Substance and Sexual Activity   Alcohol  use: Not Currently    Comment: Stopped drinking 3 weeks ago.   Drug use: Not on file   Sexual activity: Not on file  Other Topics Concern   Not on file  Social History  Narrative   Not on file   Social Drivers of Health   Financial Resource Strain: Low Risk  (04/11/2024)   Received from [facility]   Overall Financial Resource Strain (CARDIA)    How hard is it for you to pay for the very basics like food, housing, medical care, and heating?: Not hard at all  Food Insecurity: No Food Insecurity (04/11/2024)   [facility]   Hunger Vital Sign    Within the past 12 months, you worried that your food would run out before you got the money to buy more.: Never true    Within the past 12 months,  the food you bought just didn't last and you didn't have money to get more.: Never true  Transportation Needs: No Transportation Needs (04/11/2024)   Received from [facility] - Transportation    Lack of Transportation (Medical): No    Lack of Transportation (Non-Medical): No  Physical Activity: Insufficiently Active (04/11/2024)   Received from [facility]   Exercise Vital Sign    On average, how many days per week do you engage in moderate to strenuous exercise (like a brisk walk)?: 1 day    On average, how many minutes do you engage in exercise at this level?: 30 min  Stress: Stress Concern Present (04/11/2024)   Received from [facility] - Occupational Stress Questionnaire    Do you feel stress - tense, restless, nervous, or anxious, or unable to sleep at night because your mind is troubled all the time - these days?: To some extent  Social Connections: Socially Integrated (04/11/2024)   Received from [facility]   Social Connection and Isolation Panel    In a typical week, how many times do you talk on the phone with family, friends, or neighbors?: More than three times a week    How often do you get together with friends or relatives?: Once a week    How often do you attend church or religious services?: More than 4 times per year    Do you belong to any clubs or  organizations such as church groups, unions, fraternal or athletic groups, or school groups?: Yes    How often do you attend meetings of the clubs or organizations you belong to?: More than 4 times per year    Are you married, widowed, divorced, separated, never married, or living with a partner?: Married  Intimate Partner Violence: Not At Risk (10/21/2023)   Humiliation, Afraid, Rape, and Kick questionnaire    Fear of Current or Ex-Partner: No    Emotionally Abused: No    Physically Abused: No    Sexually Abused: No         Objective:  BP 108/64   Pulse 83   Temp 97.9 F (36.6 C) (Oral)   Ht 5' 1 (1.549 m)   Wt 84 lb 9.6 oz (38.4 kg)   SpO2 97% Comment: on 4l o2  BMI 15.99 kg/m    Physical Exam Constitutional:      General: She is not in acute distress.    Appearance: Normal appearance.  HENT:     Mouth/Throat:     Mouth: Mucous membranes are moist.  Cardiovascular:     Rate and Rhythm: Normal rate.  Pulmonary:     Effort: No respiratory distress.     Breath sounds: No wheezing or rales.  Musculoskeletal:     Right lower leg: No edema.     Left lower leg: No edema.  Skin:    General: Skin is warm.  Neurological:     Mental Status: She is alert and oriented to person, place, and time.  Psychiatric:        Mood and Affect: Mood normal.     Diagnostic Review:    Pft     No data to display         Reviewed prior pulmonary clinic visit notes from other  providers from 2024 and 2025     Ct chest 03/2022 1. Mild emphysema and diffuse bilateral bronchial wall thickening. 2. Lucien  scarring in the left lung base, medial segment right middle lobe and lingula, consistent with bland sequelae of prior infection or inflammation. 3. Coronary artery disease.  Reviewed outside notes and pathology notes available in care everywhere     Assessment & Plan:   Assessment & Plan COPD (chronic obstructive pulmonary disease) with chronic bronchitis (HCC) COPD  symptoms currently stable.  No COPD exacerbations in the last 6 months Continue Trelegy once a day. Continue DuoNebs/nebulizers as needed Strongly encouraged her to wear oxygen  at all times Orders:   Pulmonary function test; Future  Chronic hypoxemic respiratory failure (HCC) Patient on 3 to 4 L oxygen  at all times.  Advised her to continue to use oxygen  24/7 Orders:   Pulmonary function test; Future  Squamous cell carcinoma of mouth (HCC) Reviewed outside pathology notes.  Patient recently found to have squamous cell carcinoma on floor of her mouth.  She also reports some nodularity in her neck.  She is not aware of the status of her cancer yet.  She reports some of her scans are still pending. Orders:   Pulmonary function test; Future  Preoperative clearance I had a detailed discussion with patient and her husband in clinic today.  This was her first visit with me, however patient has been followed up in this clinic previously and has had several hospital admissions within [facility] system (as detailed in HPI).  Patient does not have absolute contraindication to proceed with anesthesia for elective head and neck surgery.  She doesn't have active infection and her COPD and chronic respiratory failure has been stable over 6 months with no exacerbations in the interim. I educated them on the risk of postoperative pulmonary complications with anesthesia, especially with her chronic lung condition and chronic oxygen  needs.  I also made them aware of potential issues with secretion clearance, aspirations which could lead to postoperative pneumonia and severe COPD patients.  I made them aware that she would be considered to have moderate to high risk for pulmonary complications for non thoracic, non abdominal surgery based on available risk calculators.  I encouraged her to discuss her options including the nature and extent of the surgery, nonoperative alternatives , prognosis with and without surgery  -with her surgeon and oncologist. If she were to proceed with surgery, would recommend following intra and post operative strategies to reduce risk for pulmonary complications- mostly post operative pneumonia and respiratory failure. 1. Limiting the duration of surgery as much as possible. 2. Complete reversal of neuromuscular blockade at the conclusion of the surgical procedure to avoid hypoventilation and post operative pulmonary complications 3. Use of post operative continuous positive airway pressure (BIPAP/CPAP) if surgery is prolonged and pt is not able to participate on lung expansion maneuvers like deep breathing exercises and incentive spirometry 4. Adequate Pain control and Early mobilization after surgery to facilitate deep breathing 5. Deep breathing exercises and incentive spirometry q 2 hrs post surgery   will schedule a PFT today. Smoking cessation advised.    Thank you for the opportunity to take part in the care of Maria Stevens   Return in about 2 months (around 06/18/2024).   Danyele Smejkal Pleas, MD [facility] Office: [phone]   I personally spent a total of 45 minutes in the care of the patient today including performing a medically appropriate exam/evaluation, counseling and educating, placing orders, referring and communicating with other health care professionals, documenting clinical information in the EHR, independently interpreting results, and communicating results.

## 2024-04-21 DIAGNOSIS — R918 Other nonspecific abnormal finding of lung field: Secondary | ICD-10-CM | POA: Diagnosis not present

## 2024-04-21 DIAGNOSIS — R911 Solitary pulmonary nodule: Secondary | ICD-10-CM | POA: Diagnosis not present

## 2024-04-21 DIAGNOSIS — M47812 Spondylosis without myelopathy or radiculopathy, cervical region: Secondary | ICD-10-CM | POA: Diagnosis not present

## 2024-04-21 DIAGNOSIS — C069 Malignant neoplasm of mouth, unspecified: Secondary | ICD-10-CM | POA: Diagnosis not present

## 2024-04-24 DIAGNOSIS — C069 Malignant neoplasm of mouth, unspecified: Secondary | ICD-10-CM | POA: Diagnosis not present

## 2024-04-26 DIAGNOSIS — C029 Malignant neoplasm of tongue, unspecified: Secondary | ICD-10-CM | POA: Diagnosis not present

## 2024-04-26 DIAGNOSIS — R64 Cachexia: Secondary | ICD-10-CM | POA: Diagnosis not present

## 2024-04-26 DIAGNOSIS — F17200 Nicotine dependence, unspecified, uncomplicated: Secondary | ICD-10-CM | POA: Diagnosis not present

## 2024-04-26 DIAGNOSIS — C049 Malignant neoplasm of floor of mouth, unspecified: Secondary | ICD-10-CM | POA: Diagnosis not present

## 2024-04-26 DIAGNOSIS — C069 Malignant neoplasm of mouth, unspecified: Secondary | ICD-10-CM | POA: Diagnosis not present

## 2024-04-26 DIAGNOSIS — D7589 Other specified diseases of blood and blood-forming organs: Secondary | ICD-10-CM | POA: Diagnosis not present

## 2024-04-26 DIAGNOSIS — C760 Malignant neoplasm of head, face and neck: Secondary | ICD-10-CM | POA: Diagnosis not present

## 2024-04-26 DIAGNOSIS — K1379 Other lesions of oral mucosa: Secondary | ICD-10-CM | POA: Diagnosis not present

## 2024-04-26 DIAGNOSIS — Z79899 Other long term (current) drug therapy: Secondary | ICD-10-CM | POA: Diagnosis not present

## 2024-05-05 DIAGNOSIS — C069 Malignant neoplasm of mouth, unspecified: Secondary | ICD-10-CM | POA: Diagnosis not present

## 2024-05-08 DIAGNOSIS — J9611 Chronic respiratory failure with hypoxia: Secondary | ICD-10-CM | POA: Diagnosis not present

## 2024-05-08 DIAGNOSIS — Z888 Allergy status to other drugs, medicaments and biological substances status: Secondary | ICD-10-CM | POA: Diagnosis not present

## 2024-05-08 DIAGNOSIS — Z886 Allergy status to analgesic agent status: Secondary | ICD-10-CM | POA: Diagnosis not present

## 2024-05-08 DIAGNOSIS — I10 Essential (primary) hypertension: Secondary | ICD-10-CM | POA: Diagnosis not present

## 2024-05-08 DIAGNOSIS — C069 Malignant neoplasm of mouth, unspecified: Secondary | ICD-10-CM | POA: Diagnosis not present

## 2024-05-08 DIAGNOSIS — E43 Unspecified severe protein-calorie malnutrition: Secondary | ICD-10-CM | POA: Diagnosis not present

## 2024-05-08 DIAGNOSIS — E7800 Pure hypercholesterolemia, unspecified: Secondary | ICD-10-CM | POA: Diagnosis not present

## 2024-05-08 DIAGNOSIS — Z87891 Personal history of nicotine dependence: Secondary | ICD-10-CM | POA: Diagnosis not present

## 2024-05-08 DIAGNOSIS — I700 Atherosclerosis of aorta: Secondary | ICD-10-CM | POA: Diagnosis not present

## 2024-05-08 DIAGNOSIS — Z882 Allergy status to sulfonamides status: Secondary | ICD-10-CM | POA: Diagnosis not present

## 2024-06-20 ENCOUNTER — Ambulatory Visit

## 2024-06-20 DIAGNOSIS — J4489 Other specified chronic obstructive pulmonary disease: Secondary | ICD-10-CM | POA: Diagnosis not present

## 2024-06-20 DIAGNOSIS — C069 Malignant neoplasm of mouth, unspecified: Secondary | ICD-10-CM

## 2024-06-20 DIAGNOSIS — J9611 Chronic respiratory failure with hypoxia: Secondary | ICD-10-CM

## 2024-06-20 LAB — PULMONARY FUNCTION TEST
DL/VA % pred: 45 %
DL/VA: 1.93 ml/min/mmHg/L
DLCO cor % pred: 31 %
DLCO cor: 5.51 ml/min/mmHg
DLCO unc % pred: 30 %
DLCO unc: 5.26 ml/min/mmHg
FEF 25-75 Post: 0.56 L/s
FEF 25-75 Pre: 0.32 L/s
FEF2575-%Change-Post: 73 %
FEF2575-%Pred-Post: 35 %
FEF2575-%Pred-Pre: 20 %
FEV1-%Change-Post: 12 %
FEV1-%Pred-Post: 55 %
FEV1-%Pred-Pre: 49 %
FEV1-Post: 1.06 L
FEV1-Pre: 0.95 L
FEV1FVC-%Change-Post: 0 %
FEV1FVC-%Pred-Pre: 74 %
FEV6-%Change-Post: 16 %
FEV6-%Pred-Post: 76 %
FEV6-%Pred-Pre: 65 %
FEV6-Post: 1.84 L
FEV6-Pre: 1.58 L
FEV6FVC-%Change-Post: 3 %
FEV6FVC-%Pred-Post: 102 %
FEV6FVC-%Pred-Pre: 98 %
FVC-%Change-Post: 11 %
FVC-%Pred-Post: 74 %
FVC-%Pred-Pre: 66 %
FVC-Post: 1.88 L
FVC-Pre: 1.68 L
Post FEV1/FVC ratio: 56 %
Post FEV6/FVC ratio: 98 %
Pre FEV1/FVC ratio: 56 %
Pre FEV6/FVC Ratio: 94 %

## 2024-06-20 NOTE — Patient Instructions (Signed)
 Full pft performed today

## 2024-06-20 NOTE — Progress Notes (Signed)
 Full pft performed today

## 2024-06-21 ENCOUNTER — Ambulatory Visit

## 2024-06-21 VITALS — BP 98/57 | HR 84 | Temp 97.9°F | Ht 64.0 in | Wt 96.6 lb

## 2024-06-21 DIAGNOSIS — F17210 Nicotine dependence, cigarettes, uncomplicated: Secondary | ICD-10-CM | POA: Diagnosis not present

## 2024-06-21 DIAGNOSIS — J4489 Other specified chronic obstructive pulmonary disease: Secondary | ICD-10-CM

## 2024-06-21 DIAGNOSIS — E43 Unspecified severe protein-calorie malnutrition: Secondary | ICD-10-CM

## 2024-06-21 DIAGNOSIS — C760 Malignant neoplasm of head, face and neck: Secondary | ICD-10-CM | POA: Diagnosis not present

## 2024-06-21 DIAGNOSIS — G893 Neoplasm related pain (acute) (chronic): Secondary | ICD-10-CM

## 2024-06-21 DIAGNOSIS — Z7189 Other specified counseling: Secondary | ICD-10-CM

## 2024-06-21 DIAGNOSIS — J9611 Chronic respiratory failure with hypoxia: Secondary | ICD-10-CM

## 2024-06-21 MED ORDER — AZITHROMYCIN 200 MG/5ML PO SUSR: 250.0000 mg | ORAL | 5 refills | Status: AC

## 2024-06-21 MED ORDER — TRELEGY ELLIPTA 200-62.5-25 MCG/ACT IN AEPB: 1.0000 | INHALATION_SPRAY | Freq: Every day | RESPIRATORY_TRACT | 5 refills | Status: AC

## 2024-06-21 MED ORDER — PREDNISONE 10 MG PO TABS: ORAL_TABLET | ORAL | 0 refills | Status: AC

## 2024-06-21 NOTE — Patient Instructions (Signed)
 Start using trelegy 200 daily instead of 100 daily Restart  azithromycin 250 mg three times a week. Call us  if you would like prednisone  long term We have sent a course of prednisone  for current flare up

## 2024-06-21 NOTE — Progress Notes (Deleted)
 New Patient Pulmonology Office Visit   Subjective:  Patient ID: Maria Stevens, female    DOB: 01-26-1951  MRN: [id number]  Referred by: Pleas Newborn, MD  CC:  Chief Complaint  Patient presents with   COPD    PFT F/U Pt states since LOV breathing hasn't been the best, doesn't see any improvement SOB occurs Prod cough ( phlegm clear) Pt is currently on 4L of poc    HPI Maria Stevens is a 73 y.o. female with ***  Discussed the use of AI scribe software for clinical note transcription with the patient, who gave verbal consent to proceed.  History of Present Illness      {PULM QUESTIONNAIRES (Optional):33196}  ROS  Allergies: Carisoprodol, Homatropine, Hydrocodone, Sulfacetamide, Sulfamethoxazole, Trimethoprim, and Aspirin   Current Outpatient Medications:    ALPRAZolam  (XANAX ) 1 MG tablet, Take 1 tablet (1 mg total) by mouth 3 (three) times daily., Disp: 10 tablet, Rfl: 0   feeding supplement (ENSURE ENLIVE / ENSURE PLUS) LIQD, Take 237 mLs by mouth 3 (three) times daily between meals., Disp: , Rfl:    FLUoxetine  (PROZAC ) 40 MG capsule, Take 40 mg by mouth daily., Disp: , Rfl:    Fluticasone -Umeclidin-Vilant (TRELEGY ELLIPTA ) 100-62.5-25 MCG/ACT AEPB, Inhale 1 puff into the lungs daily., Disp: 60 each, Rfl: 5   gabapentin (NEURONTIN) 250 MG/5ML solution, Take 200 mg by mouth., Disp: , Rfl:    lidocaine  (XYLOCAINE ) 2 % solution, Use as directed 15 mLs in the mouth or throat as needed for mouth pain., Disp: 100 mL, Rfl: 0   methadone (DOLOPHINE) 1 MG/1ML solution, 2 mg by Enteral route., Disp: , Rfl:    oxyCODONE  (ROXICODONE ) 5 MG/5ML solution, 10 mg by Enteral route., Disp: , Rfl:    acetaminophen  (TYLENOL ) 500 MG tablet, Take 2 tablets (1,000 mg total) by mouth every 8 (eight) hours. (Patient not taking: Reported on 06/21/2024), Disp: , Rfl:    amoxicillin -clavulanate (AUGMENTIN ) 875-125 MG tablet, Take 1 tablet by mouth 2 (two) times daily. (Patient not taking: Reported  on 02/24/2024), Disp: 20 tablet, Rfl: 0   aspirin  325 MG tablet, Take 1 tablet (325 mg total) by mouth daily. X 28 days (Patient not taking: Reported on 06/21/2024), Disp: , Rfl:    doxycycline  (VIBRA -TABS) 100 MG tablet, Take 1 tablet (100 mg total) by mouth 2 (two) times daily. (Patient not taking: Reported on 04/18/2024), Disp: 20 tablet, Rfl: 0   ergocalciferol  (VITAMIN D2) 1.25 MG (50000 UT) capsule, Take 50,000 Units by mouth once a week. (Patient not taking: Reported on 06/21/2024), Disp: , Rfl:    famotidine  (PEPCID ) 40 MG tablet, Take 40 mg by mouth daily. (Patient not taking: Reported on 06/21/2024), Disp: , Rfl:    ferrous sulfate  325 (65 FE) MG tablet, Take 1 tablet (325 mg total) by mouth 2 (two) times daily with a meal. (Patient not taking: Reported on 02/24/2024), Disp: , Rfl:    folic acid  (FOLVITE ) 1 MG tablet, Take 1 tablet (1 mg total) by mouth daily. (Patient not taking: Reported on 06/21/2024), Disp: , Rfl:    methylPREDNISolone  (MEDROL  DOSEPAK) 4 MG TBPK tablet, Take with signs of chronic sinusitis and take as directed (Patient not taking: Reported on 02/24/2024), Disp: 1 each, Rfl: 1   metoprolol  tartrate (LOPRESSOR ) 25 MG tablet, Take 0.5 tablets (12.5 mg total) by mouth 2 (two) times daily. (Patient not taking: Reported on 04/18/2024), Disp: , Rfl:    mirtazapine (REMERON) 15 MG tablet, Take 7.5 mg by mouth at  bedtime. (Patient not taking: Reported on 04/18/2024), Disp: , Rfl:    predniSONE  (DELTASONE ) 10 MG tablet, Take 1 tablet (10 mg total) by mouth daily with breakfast. Take two pills/day for 3 days then one pill/day for 3 days (Patient not taking: Reported on 04/18/2024), Disp: 9 tablet, Rfl: 0   rosuvastatin  (CRESTOR ) 20 MG tablet, Take 20 mg by mouth 2 (two) times a week. (Patient not taking: Reported on 06/21/2024), Disp: , Rfl:    traMADol  (ULTRAM ) 50 MG tablet, Take 1 tablet (50 mg total) by mouth every 8 (eight) hours as needed. 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[facility])    Past Surgical History:  Procedure Laterality Date   ORIF HIP FRACTURE Right 09/28/2023   Procedure: OPEN REDUCTION INTERNAL FIXATION HIP WITH PLATE AND SCREWS;  Surgeon: Margrette Taft BRAVO, MD;  Location: [facility];  Service: Orthopedics;  Laterality: Right;  compression hip, 2 hole plate   PLACEMENT OF BREAST IMPLANTS     reported by patient   Family History  Problem Relation Age of Onset   Heart disease Mother    Emphysema Father    Allergies Sister    Cancer Brother    Social History   Socioeconomic History   Marital status: Married    Spouse name: Not on file   Number of children: Not on file   Years of education: Not on file   Highest education level: Not on file  Occupational History   Not on file  Tobacco Use   Smoking status: Some Days    Current packs/day: 0.00    Types: Cigarettes    Last attempt to quit: 01/30/2021    Years since quitting: 3.3   Smokeless tobacco: Never   Tobacco comments:    Pt smokes due to anxiety. 02/23/2024    Pt smokes when stressed 04/18/2024  Substance and Sexual Activity   Alcohol  use: Not Currently    Comment: Stopped drinking 3 weeks ago.   Drug use: Not on file   Sexual activity: Not on file  Other Topics Concern   Not on file  Social History Narrative   Not on file   Social Drivers of Health   Financial Resource Strain: Low Risk (04/11/2024)   Received from [facility]   Overall Financial Resource Strain (CARDIA)    How hard is it for you to pay for the very basics like food, housing, medical care, and heating?: Not hard at all  Food Insecurity: No Food Insecurity (04/11/2024)   Received from [facility]   Hunger Vital Sign    Within the past 12 months, you worried that your food would run out before you got the money to buy more.: Never  true    Within the past 12 months, the food you bought just didn't last and you didn't have money to get more.: Never true  Transportation Needs: No Transportation Needs (04/11/2024)   Received from [facility] - Transportation    Lack of Transportation (Medical): No    Lack of Transportation (Non-Medical): No  Physical Activity: Insufficiently Active (04/11/2024)   Received from [facility]   Exercise Vital Sign    On average, how many days per week do you engage in moderate to  strenuous exercise (like a brisk walk)?: 1 day    On average, how many minutes do you engage in exercise at this level?: 30 min  Stress: Stress Concern Present (04/11/2024)   Received from [facility] - Occupational Stress Questionnaire    Do you feel stress - tense, restless, nervous, or anxious, or unable to sleep at night because your mind is troubled all the time - these days?: To some extent  Social Connections: Socially Integrated (04/11/2024)   Received from [facility]   Social Connection and Isolation Panel    In a typical week, how many times do you talk on the phone with family, friends, or neighbors?: More than three times a week    How often do you get together with friends or relatives?: Once a week    How often do you attend church or religious services?: More than 4 times per year    Do you belong to any clubs or organizations such as church groups, unions, fraternal or athletic groups, or school groups?: Yes    How often do you attend meetings of the clubs or organizations you belong to?: More than 4 times per year    Are you married, widowed, divorced, separated, never married, or living with a partner?: Married  Intimate Partner Violence: Not At Risk (10/21/2023)   Humiliation, Afraid, Rape, and Kick questionnaire    Fear of Current or Ex-Partner: No    Emotionally Abused: No    Physically Abused: No    Sexually Abused: No          Objective:  BP (!) 98/57   Pulse 84   Temp 97.9 F (36.6 C) (Oral)   Ht 5' 4 (1.626 m) Comment: per patient  Wt 96 lb 9.6 oz (43.8 kg)   SpO2 92% Comment: on 4L of poc  BMI 16.58 kg/m  {Pulm Vitals (Optional):32837}  Physical Exam  Diagnostic Review:  {Labs (Optional):32838}  Pft    Latest Ref Rng & Units 06/20/2024    3:31 PM  PFT Results  FVC-Pre L 1.68  P  FVC-Predicted Pre % 66  P  FVC-Post L 1.88  P  FVC-Predicted Post % 74  P  Pre FEV1/FVC % % 56  P  Post FEV1/FCV % % 56  P  FEV1-Pre L 0.95  P  FEV1-Predicted Pre % 49  P  FEV1-Post L 1.06  P  DLCO uncorrected ml/min/mmHg 5.26  P  DLCO UNC% % 30  P  DLCO corrected ml/min/mmHg 5.51  P  DLCO COR %Predicted % 31  P  DLVA Predicted % 45  P    P Preliminary result         Results       Assessment & Plan:   Assessment & Plan COPD (chronic obstructive pulmonary disease) with chronic bronchitis (HCC)       Thank you for the opportunity to take part in the care of Maria Stevens   No follow-ups on file.   Fate Caster Pleas, MD [facility] Office: [phone]

## 2024-06-21 NOTE — Progress Notes (Signed)
 New Patient Pulmonology Office Visit   Subjective:  Patient ID: Maria Stevens, female    DOB: [birth year]  MRN: [id number]  Referred by: Pleas Newborn, MD  CC:  Chief Complaint  Patient presents with   COPD    PFT F/U Pt states since LOV breathing hasn't been the best, doesn't see any improvement SOB occurs Prod cough ( phlegm clear) Pt is currently on 4L of poc    HPI 04/2024 visit  COPD Her past medical history is significant for COPD.   Maria Stevens is a 73 y.o. female who follows with Dr Shelah in this clinic for COPD and chronic hypoxic respiratory failure typically.  Last seen by him in August, 2025.  She was followed for multiple scattered cavitary pulm nodules, which decreased in size on serial CT chest.  Has had pneumonia during hospital admissions after a fall and left proximal humerus fracture in 2024. She was admitted with acute right hip fracture in 09/2023 and tolerated ORIF well, didn't have pulmonary complications then.  Denies COPD exacerbations in last 6 months. She is on Trelegy daily.  Previously tried on azithromycin 3 times weekly but discontinued due to suboptimal response. Reports compliance to trelegy She is on 4 L oxygen  at all times. She continues to smoke. She was admitted to the hospital in March/2025 for right femur fracture.  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 She wanted to be seen in this clinic to discuss her options including preoperative pulmonary clearance.  Interval hx 07/01/2024: Pt reports since last visit she was seen by ENT at [facility]. Surgery was not offered, pt was referred to palliative. She has established with local palliative team. They are still waiting to hear back from radiation oncology, hoping to find answers on potential palliative radiation She reports pain in mouth and is not on methadone and other medications through palliative team  She reports her  shortness of breath has worsened Takes trelegy daily Duonebs made her jittery and anxious  She had 2 copd exacerbations this year- one in march and one in Oct 2025 Previously she was on chronic azithromycin, husband is interested to resume it she is on 4 L oxygen  at all times.  ROS Review of symptoms negative except mentioned above   Allergies: Carisoprodol, Homatropine, Hydrocodone, Sulfacetamide, Sulfamethoxazole, Trimethoprim, and Aspirin   Current Outpatient Medications:    ALPRAZolam  (XANAX ) 1 MG tablet, Take 1 tablet (1 mg total) by mouth 3 (three) times daily., Disp: 10 tablet, Rfl: 0   azithromycin (ZITHROMAX) 200 MG/5ML suspension, Take 6.3 mLs (250 mg total) by mouth 3 (three) times a week., Disp: 45 mL, Rfl: 5   feeding supplement (ENSURE ENLIVE / ENSURE PLUS) LIQD, Take 237 mLs by mouth 3 (three) times daily between meals., Disp: , Rfl:    FLUoxetine  (PROZAC ) 40 MG capsule, Take 40 mg by mouth daily., Disp: , Rfl:    Fluticasone -Umeclidin-Vilant (TRELEGY ELLIPTA ) 200-62.5-25 MCG/ACT AEPB, Inhale 1 puff into the lungs daily., Disp: 1 each, Rfl: 5   gabapentin (NEURONTIN) 250 MG/5ML solution, Take 200 mg by mouth., Disp: , Rfl:    lidocaine  (XYLOCAINE ) 2 % solution, Use as directed 15 mLs in the mouth or throat as needed for mouth pain., Disp: 100 mL, Rfl: 0   methadone (DOLOPHINE) 1 MG/1ML solution, 2 mg by Enteral route., Disp: , Rfl:    oxyCODONE  (ROXICODONE ) 5 MG/5ML solution, 10 mg by Enteral route., Disp: , Rfl:  predniSONE  (DELTASONE ) 10 MG tablet, Take 4 tab a day for 3 days, 3 tab a day for 3 days, 2 tab a day for 2 days and 1 tab a day for 2 days and stop, Disp: 27 tablet, Rfl: 0   acetaminophen  (TYLENOL ) 500 MG tablet, Take 2 tablets (1,000 mg total) by mouth every 8 (eight) hours. (Patient not taking: Reported on 06/21/2024), Disp: , Rfl:    amoxicillin -clavulanate (AUGMENTIN ) 875-125 MG tablet, Take 1 tablet by mouth 2 (two) times daily. (Patient not taking: Reported  on 02/24/2024), Disp: 20 tablet, Rfl: 0   aspirin  325 MG tablet, Take 1 tablet (325 mg total) by mouth daily. X 28 days (Patient not taking: Reported on 06/21/2024), Disp: , Rfl:    doxycycline  (VIBRA -TABS) 100 MG tablet, Take 1 tablet (100 mg total) by mouth 2 (two) times daily. (Patient not taking: Reported on 04/18/2024), Disp: 20 tablet, Rfl: 0   ergocalciferol  (VITAMIN D2) 1.25 MG (50000 UT) capsule, Take 50,000 Units by mouth once a week. (Patient not taking: Reported on 06/21/2024), Disp: , Rfl:    famotidine  (PEPCID ) 40 MG tablet, Take 40 mg by mouth daily. (Patient not taking: Reported on 06/21/2024), Disp: , Rfl:    ferrous sulfate  325 (65 FE) MG tablet, Take 1 tablet (325 mg total) by mouth 2 (two) times daily with a meal. (Patient not taking: Reported on 02/24/2024), Disp: , Rfl:    folic acid  (FOLVITE ) 1 MG tablet, Take 1 tablet (1 mg total) by mouth daily. (Patient not taking: Reported on 06/21/2024), Disp: , Rfl:    methylPREDNISolone  (MEDROL  DOSEPAK) 4 MG TBPK tablet, Take with signs of chronic sinusitis and take as directed (Patient not taking: Reported on 02/24/2024), Disp: 1 each, Rfl: 1   metoprolol  tartrate (LOPRESSOR ) 25 MG tablet, Take 0.5 tablets (12.5 mg total) by mouth 2 (two) times daily. (Patient not taking: Reported on 04/18/2024), Disp: , Rfl:    mirtazapine (REMERON) 15 MG tablet, Take 7.5 mg by mouth at bedtime. (Patient not taking: Reported on 04/18/2024), Disp: , Rfl:    predniSONE  (DELTASONE ) 10 MG tablet, Take 1 tablet (10 mg total) by mouth daily with breakfast. Take two pills/day for 3 days then one pill/day for 3 days (Patient not taking: Reported on 04/18/2024), Disp: 9 tablet, Rfl: 0   rosuvastatin  (CRESTOR ) 20 MG tablet, Take 20 mg by mouth 2 (two) times a week. (Patient not taking: Reported on 06/21/2024), Disp: , Rfl:    traMADol  (ULTRAM ) 50 MG tablet, Take 1 tablet (50 mg total) by mouth every 8 (eight) hours as needed. 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[facility])    Past Surgical History:  Procedure Laterality Date   ORIF HIP FRACTURE Right 09/28/2023   Procedure: OPEN REDUCTION INTERNAL FIXATION HIP WITH PLATE AND SCREWS;  Surgeon: Margrette Taft BRAVO, MD;  Location: [facility];  Service: Orthopedics;  Laterality: Right;  compression hip, 2 hole plate   PLACEMENT OF BREAST IMPLANTS     reported by patient   Family History  Problem Relation Age of Onset   Heart disease Mother    Emphysema Father    Allergies Sister    Cancer Brother    Social History   Socioeconomic History   Marital status: Married    Spouse name:  Not on file   Number of children: Not on file   Years of education: Not on file   Highest education level: Not on file  Occupational History   Not on file  Tobacco Use   Smoking status: Some Days    Current packs/day: 0.00    Types: Cigarettes    Last attempt to quit: 01/30/2021    Years since quitting: 3.3   Smokeless tobacco: Never   Tobacco comments:    Pt smokes due to anxiety. 02/23/2024    Pt smokes when stressed 04/18/2024  Substance and Sexual Activity   Alcohol  use: Not Currently    Comment: Stopped drinking 3 weeks ago.   Drug use: Not on file   Sexual activity: Not on file  Other Topics Concern   Not on file  Social History Narrative   Not on file   Social Drivers of Health   Financial Resource Strain: Low Risk (04/11/2024)   Received from [facility]   Overall Financial Resource Strain (CARDIA)    How hard is it for you to pay for the very basics like food, housing, medical care, and heating?: Not hard at all  Food Insecurity: No Food Insecurity (04/11/2024)   Received from [facility]   Hunger Vital Sign    Within the past 12 months, you worried that your food would run out before you got the money to buy more.: Never  true    Within the past 12 months, the food you bought just didn't last and you didn't have money to get more.: Never true  Transportation Needs: No Transportation Needs (04/11/2024)   Received from [facility] - Transportation    Lack of Transportation (Medical): No    Lack of Transportation (Non-Medical): No  Physical Activity: Insufficiently Active (04/11/2024)   Received from [facility]   Exercise Vital Sign    On average, how many days per week do you engage in moderate to strenuous exercise (like a brisk walk)?: 1 day    On average, how many minutes do you engage in exercise at this level?: 30 min  Stress: Stress Concern Present (04/11/2024)   Received from [facility] - Occupational Stress Questionnaire    Do you feel stress - tense, restless, nervous, or anxious, or unable to sleep at night because your mind is troubled all the time - these days?: To some extent  Social Connections: Socially Integrated (04/11/2024)   Received from [facility]   Social Connection and Isolation Panel    In a typical week, how many times do you talk on the phone with family, friends, or neighbors?: More than three times a week    How often do you get together with friends or relatives?: Once a week    How often do you attend church or religious services?: More than 4 times per year    Do you belong to any clubs or organizations such as church groups, unions, fraternal or athletic groups, or school groups?: Yes    How often do you attend meetings of the clubs or organizations you belong to?: More than 4 times per year    Are you married, widowed, divorced, separated, never married, or living with a partner?: Married  Intimate Partner Violence: Not At Risk (10/21/2023)   Humiliation, Afraid, Rape, and Kick questionnaire    Fear of Current or Ex-Partner: No    Emotionally Abused:  No    Physically Abused: No    Sexually Abused: No          Objective:  BP (!) 98/57   Pulse 84   Temp 97.9 F (36.6 C) (Oral)   Ht 5' 4 (1.626 m) Comment: per patient  Wt 96 lb 9.6 oz (43.8 kg)   SpO2 92% Comment: on 4L of poc  BMI 16.58 kg/m    Physical Exam Constitutional:      General: She is not in acute distress.    Appearance: Normal appearance.     Comments: Thin cachetic female  HENT:     Mouth/Throat:     Mouth: Mucous membranes are moist.  Cardiovascular:     Rate and Rhythm: Normal rate.  Pulmonary:     Effort: No respiratory distress.     Breath sounds: No wheezing or rales.  Abdominal:     Comments: PEG tube  Musculoskeletal:     Right lower leg: No edema.     Left lower leg: No edema.  Skin:    General: Skin is warm.  Neurological:     Mental Status: She is alert and oriented to person, place, and time.  Psychiatric:        Mood and Affect: Mood normal.     Diagnostic Review:    Pft    Latest Ref Rng & Units 06/20/2024    3:31 PM  PFT Results  FVC-Pre L 1.68  P  FVC-Predicted Pre % 66  P  FVC-Post L 1.88  P  FVC-Predicted Post % 74  P  Pre FEV1/FVC % % 56  P  Post FEV1/FCV % % 56  P  FEV1-Pre L 0.95  P  FEV1-Predicted Pre % 49  P  FEV1-Post L 1.06  P  DLCO uncorrected ml/min/mmHg 5.26  P  DLCO UNC% % 30  P  DLCO corrected ml/min/mmHg 5.51  P  DLCO COR %Predicted % 31  P  DLVA Predicted % 45  P    P Preliminary result  Severe obstruction with positive BD response Evidence of air trapping and low diffusion capacity   Reviewed prior pulmonary clinic visit notes from other  providers from 2024 and 2025     CT chest 03/2022 1. Mild emphysema and diffuse bilateral bronchial wall thickening. 2. Bandlike scarring in the left lung base, medial segment right middle lobe and lingula, consistent with bland sequelae of prior infection or inflammation. 3. Coronary artery disease.  Reviewed outside notes and pathology notes available in care everywhere    EKG 09/2023 Qtc 450  Assessment &  Plan:   Assessment & Plan COPD (chronic obstructive pulmonary disease) with chronic bronchitis (HCC) Discussed the symptoms, etiology, pathophysiology, diagnostic test, treatment, flare ups,  prognosis of copd Suboptimal control Will change trelegy 100 to 200 Offered atrovent  neb instead of duonebs d/t A/E but pt would want to hold off on that Will resume azithromycin three times a week given recurrent copd exacerbation. EKG performed today- qtc 416 and 446 on repeat Currently she has mild flare up and will treat with a course of steroids Offered long term steroids for symptoms management as pt is receiving palliative care otherwise, discussed A/E. They want to hold off on that, still waiting to hear from palliative radiation team as per husband Orders:   Fluticasone -Umeclidin-Vilant (TRELEGY ELLIPTA ) 200-62.5-25 MCG/ACT AEPB; Inhale 1 puff into the lungs daily.   azithromycin (ZITHROMAX) 200 MG/5ML suspension; Take 6.3 mLs (250 mg total) by mouth 3 (  three) times a week.   predniSONE  (DELTASONE ) 10 MG tablet; Take 4 tab a day for 3 days, 3 tab a day for 3 days, 2 tab a day for 2 days and 1 tab a day for 2 days and stop   EKG 12-Lead  Severe protein-calorie malnutrition Has new PEG tube Encouraged routine Tube feeds Liquid/soln chronic azithromycin Orders:   EKG 12-Lead  Head and neck cancer (HCC) As per ENT and rad/onc Discussed progression over time  Orders:   EKG 12-Lead  Goals of care, counseling/discussion Discussed progression of disease over time  Encouraged symptom management  Offered long term steroids for symptoms management as pt is receiving palliative care otherwise, discussed A/E. They want to hold off on that, still waiting to hear from palliative radiation team as per husband Orders:   EKG 12-Lead  Chronic respiratory failure with hypoxia (HCC) Continue oxygen  at all times  Orders:   azithromycin (ZITHROMAX) 200 MG/5ML suspension; Take 6.3 mLs (250 mg total) by  mouth 3 (three) times a week.  Cancer associated pain As per palliative     EKG performed in clinic today and interpreted   Thank you for the opportunity to take part in the care of Maria Stevens   Return in about 3 months (around 09/19/2024).   Indi Willhite Pleas, MD [facility] Office: [phone]   I personally spent a total of 40 minutes in the care of the patient today including performing a medically appropriate exam/evaluation, counseling and educating, placing orders, reviewing outside records, documenting clinical information in the EHR,  and communicating results.

## 2024-06-21 NOTE — Assessment & Plan Note (Addendum)
 Has new PEG tube Encouraged routine Tube feeds Liquid/soln chronic azithromycin Orders:   EKG 12-Lead
